# Patient Record
Sex: Male | Born: 1950
Health system: Southern US, Community
[De-identification: ages and names within clinical notes are randomized; demographics above are authoritative.]

## PROBLEM LIST (undated history)

## (undated) DIAGNOSIS — I499 Cardiac arrhythmia, unspecified: Secondary | ICD-10-CM

## (undated) DIAGNOSIS — T8859XA Other complications of anesthesia, initial encounter: Secondary | ICD-10-CM

## (undated) DIAGNOSIS — IMO0001 Reserved for inherently not codable concepts without codable children: Secondary | ICD-10-CM

## (undated) DIAGNOSIS — T4145XA Adverse effect of unspecified anesthetic, initial encounter: Secondary | ICD-10-CM

## (undated) DIAGNOSIS — I10 Essential (primary) hypertension: Secondary | ICD-10-CM

## (undated) DIAGNOSIS — F32A Depression, unspecified: Secondary | ICD-10-CM

## (undated) DIAGNOSIS — I4819 Other persistent atrial fibrillation: Secondary | ICD-10-CM

## (undated) DIAGNOSIS — R32 Unspecified urinary incontinence: Secondary | ICD-10-CM

## (undated) DIAGNOSIS — R112 Nausea with vomiting, unspecified: Secondary | ICD-10-CM

## (undated) DIAGNOSIS — F329 Major depressive disorder, single episode, unspecified: Secondary | ICD-10-CM

## (undated) DIAGNOSIS — F419 Anxiety disorder, unspecified: Secondary | ICD-10-CM

## (undated) DIAGNOSIS — Z9889 Other specified postprocedural states: Secondary | ICD-10-CM

## (undated) HISTORY — PX: REPLACEMENT TOTAL KNEE: SUR1224

## (undated) HISTORY — PX: JOINT REPLACEMENT: SHX530

## (undated) HISTORY — PX: OTHER SURGICAL HISTORY: SHX169

## (undated) HISTORY — DX: Anxiety disorder, unspecified: F41.9

## (undated) HISTORY — PX: PROSTATECTOMY: SHX69

---

## 1987-04-04 HISTORY — PX: BRAIN SURGERY: SHX531

## 2007-11-21 ENCOUNTER — Inpatient Hospital Stay (HOSPITAL_COMMUNITY): Admission: RE | Admit: 2007-11-21 | Discharge: 2007-11-24 | Payer: Self-pay | Admitting: Orthopedic Surgery

## 2010-08-16 NOTE — Op Note (Signed)
NAME:  Edward Johnston, Edward Johnston            ACCOUNT NO.:  [id number]   MEDICAL RECORD NO.:  [id number]          PATIENT TYPE:  INP   LOCATION:  5009                         FACILITY:  [facility]   PHYSICIAN:  Nadara Mustard, MD     DATE OF BIRTH:  31-Aug-1950   DATE OF PROCEDURE:  11/21/2007  DATE OF DISCHARGE:                               OPERATIVE REPORT   PREOPERATIVE DIAGNOSIS:  Osteoarthritis, left knee.   POSTOPERATIVE DIAGNOSIS:  Osteoarthritis, left knee.   PROCEDURE:  Left total knee arthroplasty with a #4 tibia, a #4 femur, 10-  mm posterior stabilized poly with a 42-mm patella.   SURGEON:  Nadara Mustard, MD   ANESTHESIA:  General plus femoral block.   ESTIMATED BLOOD LOSS:  300 mL.   ANTIBIOTICS:  Kefzol 1 g.   DRAINS:  None.   COMPLICATIONS:  None.   TOURNIQUET TIME:  65 minutes at 300 mmHg at the thigh.   DISPOSITION:  To PACU in stable condition.   PROCEDURE:  The patient is a 60 year old gentleman with osteoarthritis  of his left knee.  He has failed conservative care, has pain with  activities of daily living. and presents at this time for total knee  arthroplasty.  Risks and benefits were discussed including infection,  neurovascular injury, persistent pain, and need for additional surgery.  The patient states he understands and wishes to proceed at this time.   DESCRIPTION OF PROCEDURE:  The patient was brought to OR room 1.  After  undergoing a femoral block, he then underwent a general anesthetic.  After adequate level of anesthesia was obtained, the patient's left  lower extremity was prepped using DuraPrep and draped into a sterile  field.  Edward Johnston was used to cover all exposed skin.  A midline incision  was made.  This was carried down to the medial parapatellar retinacular  incision.  This has allowed the patella to be everted.  The femoral  canal reamer was used.  The IM guide for the femur with 5 degrees of  valgus and 11 mm was taken off at distal femur.   This sized for size 4  and the chamfer cuts were made for the size 4 with the cutting block in  place.  Attention was then focused on the tibia.  This was set to take  10 mm off the lateral tibial plateau.  The cut was made on neutral  posterior slope, neutral varus and valgus slope.  The block was then  sized for size 4 and the keel cuts were made for the keel and the keel  was inserted.  This was then tried with a size 10 spacer.  This had good  flexion and extension.  Varus and valgus stability was intact.  The  trial components were removed.  Leg cuts were made for the trial femur.  The patella was resurfaced, then this was sized for 41 and the leg cuts  were made for the 41 patella.  The wound was irrigated with pulsatile  lavage.  The meniscus and all debris was removed.  The posterior aspect  of the  capsule was injected with a total of 30 mL of 0.5% Marcaine  plain.  Care was taken not to inject intravascularly.  The cement was  mixed after cleansing of the bone.  The tibial tray was cemented in  place, loose cement was removed.  The femoral component was inserted,  loose cement was removed.  The tibial tray was placed after pulsatile  lavage and the patella was cemented in place.  The knee was kept in  extension until the cement had hardened.  There was a valgus tilt to the  patella and a lateral release was performed.  The tourniquet was  deflated after 65 minutes.  Hemostasis was obtained.  The retinacular  incision was closed using #1 Vicryl.  The subcu was closed using 2-0  Vicryl.  The skin was closed using approximated staples.  The wound was  covered with Adaptic orthopedic sponges, ABD dressing, Webril, and  Coban.  The patient was extubated and taken to PACU in stable condition.      Nadara Mustard, MD  Electronically Signed     MVD/MEDQ  D:  11/21/2007  T:  11/22/2007  Job:  [id number]

## 2010-08-19 NOTE — Discharge Summary (Signed)
NAME:  Edward Johnston, Edward Johnston            ACCOUNT NO.:  [id number]   MEDICAL RECORD NO.:  [id number]          PATIENT TYPE:  INP   LOCATION:  5009                         FACILITY:  [facility]   PHYSICIAN:  Nadara Mustard, MD     DATE OF BIRTH:  July 19, 1950   DATE OF ADMISSION:  11/21/2007  DATE OF DISCHARGE:  11/24/2007                               DISCHARGE SUMMARY   FINAL DIAGNOSIS:  Osteoarthritis, left knee.   PROCEDURE:  Left total knee arthroplasty with DePuy components rotating  platform, #4 tibia and femur, 10-mm poly tray with a 42-mm patella.   Discharged to home in stable condition with home health physical therapy  [facility].  Follow up in the office in 2 weeks.   HISTORY OF PRESENT ILLNESS:  The patient is a 60 year old gentleman with  osteoarthritis of his left knee.  He has failed conservative care and  has pain with activities of daily living and presents at this time for  total knee arthroplasty.   The patient's hospital course is essentially unremarkable.  He underwent  a left total knee arthroplasty on November 21, 2007.  He received a  femoral block, including his general anesthetic for surgery.  He  received Kefzol for infection prophylaxis, and was started on Coumadin  for DVT prophylaxis.  Postoperatively, the patient progressed well with  his physical therapy.  [facility] was [facility] for [facility] as well  as home health physical therapy.  The patient was discharged to home in  stable condition on November 24, 2007, with prescriptions for Tylox for  pain and Coumadin for DVT prophylaxis with followup in the office in 2  weeks.      Nadara Mustard, MD  Electronically Signed     MVD/MEDQ  D:  01/01/2008  T:  01/01/2008  Job:  [id number]

## 2013-08-18 DIAGNOSIS — N138 Other obstructive and reflux uropathy: Secondary | ICD-10-CM

## 2013-08-18 DIAGNOSIS — N401 Enlarged prostate with lower urinary tract symptoms: Secondary | ICD-10-CM

## 2013-08-18 DIAGNOSIS — C61 Malignant neoplasm of prostate: Secondary | ICD-10-CM

## 2013-08-18 HISTORY — DX: Malignant neoplasm of prostate: C61

## 2013-08-18 HISTORY — DX: Other obstructive and reflux uropathy: N13.8

## 2013-08-18 HISTORY — DX: Benign prostatic hyperplasia with lower urinary tract symptoms: N40.1

## 2013-12-24 DIAGNOSIS — N393 Stress incontinence (female) (male): Secondary | ICD-10-CM | POA: Insufficient documentation

## 2013-12-24 HISTORY — DX: Stress incontinence (female) (male): N39.3

## 2014-01-14 ENCOUNTER — Emergency Department (HOSPITAL_COMMUNITY)
Admission: EM | Admit: 2014-01-14 | Discharge: 2014-01-15 | Disposition: A | Discharge status: Home / Self Care | Payer: MEDICARE | Attending: Emergency Medicine | Admitting: Emergency Medicine

## 2014-01-14 DIAGNOSIS — Z79899 Other long term (current) drug therapy: Secondary | ICD-10-CM | POA: Diagnosis not present

## 2014-01-14 DIAGNOSIS — F329 Major depressive disorder, single episode, unspecified: Secondary | ICD-10-CM | POA: Diagnosis present

## 2014-01-14 DIAGNOSIS — F331 Major depressive disorder, recurrent, moderate: Secondary | ICD-10-CM | POA: Insufficient documentation

## 2014-01-14 DIAGNOSIS — F1310 Sedative, hypnotic or anxiolytic abuse, uncomplicated: Secondary | ICD-10-CM | POA: Insufficient documentation

## 2014-01-14 DIAGNOSIS — I10 Essential (primary) hypertension: Secondary | ICD-10-CM | POA: Diagnosis not present

## 2014-01-14 HISTORY — DX: Essential (primary) hypertension: I10

## 2014-01-14 HISTORY — DX: Unspecified urinary incontinence: R32

## 2014-01-14 HISTORY — DX: Depression, unspecified: F32.A

## 2014-01-14 HISTORY — DX: Major depressive disorder, single episode, unspecified: F32.9

## 2014-01-15 ENCOUNTER — Encounter (HOSPITAL_COMMUNITY): Payer: Self-pay | Admitting: Emergency Medicine

## 2014-01-15 LAB — COMPREHENSIVE METABOLIC PANEL
ALK PHOS: 52 U/L (ref 39–117)
ALT: 27 U/L (ref 0–53)
ANION GAP: 10 (ref 5–15)
AST: 31 U/L (ref 0–37)
Albumin: 3.9 g/dL (ref 3.5–5.2)
BILIRUBIN TOTAL: 0.3 mg/dL (ref 0.3–1.2)
BUN: 17 mg/dL (ref 6–23)
CHLORIDE: 103 meq/L (ref 96–112)
CO2: 26 meq/L (ref 19–32)
CREATININE: 1.12 mg/dL (ref 0.50–1.35)
Calcium: 9.4 mg/dL (ref 8.4–10.5)
GFR calc Af Amer: 78 mL/min — ABNORMAL LOW (ref >90)
GFR, EST NON AFRICAN AMERICAN: 67 mL/min — AB (ref >90)
Glucose, Bld: 123 mg/dL — ABNORMAL HIGH (ref 70–99)
POTASSIUM: 4.8 meq/L (ref 3.7–5.3)
Sodium: 139 mEq/L (ref 137–147)
Total Protein: 7.5 g/dL (ref 6.0–8.3)

## 2014-01-15 LAB — CBC
HEMATOCRIT: 42.9 % (ref 39.0–52.0)
HEMOGLOBIN: 14.7 g/dL (ref 13.0–17.0)
MCH: 30.5 pg (ref 26.0–34.0)
MCHC: 34.3 g/dL (ref 30.0–36.0)
MCV: 89 fL (ref 78.0–100.0)
Platelets: 210 10*3/uL (ref 150–400)
RBC: 4.82 MIL/uL (ref 4.22–5.81)
RDW: 13.2 % (ref 11.5–15.5)
WBC: 7.2 10*3/uL (ref 4.0–10.5)

## 2014-01-15 LAB — RAPID URINE DRUG SCREEN, HOSP PERFORMED
Amphetamines: NOT DETECTED
BARBITURATES: NOT DETECTED
BENZODIAZEPINES: POSITIVE — AB
COCAINE: NOT DETECTED
Opiates: NOT DETECTED
TETRAHYDROCANNABINOL: NOT DETECTED

## 2014-01-15 LAB — ACETAMINOPHEN LEVEL

## 2014-01-15 LAB — ETHANOL

## 2014-01-15 LAB — SALICYLATE LEVEL: Salicylate Lvl: <2 mg/dL — ABNORMAL LOW (ref 2.8–20.0)

## 2014-01-15 MED ORDER — LORAZEPAM 1 MG PO TABS
1.0000 mg | ORAL_TABLET | Freq: Once | ORAL | Status: AC
  Administered 2014-01-15: 1 mg via ORAL
  Filled 2014-01-15: qty 1

## 2014-01-15 MED ORDER — LORAZEPAM 1 MG PO TABS: 1.0000 mg | ORAL_TABLET | Freq: Three times a day (TID) | ORAL | Status: DC | PRN

## 2014-01-15 NOTE — ED Provider Notes (Signed)
Medical screening examination/treatment/procedure(s) were performed by non-physician practitioner and as supervising physician I was immediately available for consultation/collaboration.   EKG Interpretation None        Debby Freiberg, MD 01/15/14 2250

## 2014-01-15 NOTE — ED Provider Notes (Signed)
CSN: [id number]     Arrival date & time 01/14/14  2328 History   First MD Initiated Contact with Patient 01/15/14 0107     Chief Complaint  Patient presents with  . Suicidal  . Depression     (Consider location/radiation/quality/duration/timing/severity/associated sxs/prior Treatment) HPI Comments: Patient is a 63 year old male with a past medical history of depression who presents with worsening feelings of hopelessness and episodes of sobbing. Patient has struggled with depression for the past 20 years and has seen Dr. Reece Levy as his psychiatrist. Patient is becoming frustrated with his care because he feels as though he is not being heard by his doctor. This current episode has continued for the past 3 months. Patient's daughter is present with the patient who has been increasingly worried about her father. She states he will lose focus and do things he doesn't remember such as leaving his car door open in the driveway and the front door open. Patient denies drug and alcohol use. He denies SI and HI. Patient was seen by a Mobile Crisis Unit prior to coming to the ED tonight.    Past Medical History  Diagnosis Date  . Depression   . Incontinence   . Hypertension     due to psych med   Past Surgical History  Procedure Laterality Date  . Prostatectomy    . Replacement total knee    .  aneurysm clip     No family history on file. History  Substance Use Topics  . Smoking status: Never Smoker   . Smokeless tobacco: Not on file  . Alcohol Use: No    Review of Systems  Constitutional: Negative for fever, chills and fatigue.  HENT: Negative for trouble swallowing.   Eyes: Negative for visual disturbance.  Respiratory: Negative for shortness of breath.   Cardiovascular: Negative for chest pain and palpitations.  Gastrointestinal: Negative for nausea, vomiting, abdominal pain and diarrhea.  Genitourinary: Negative for dysuria and difficulty urinating.  Musculoskeletal: Negative for  arthralgias and neck pain.  Skin: Negative for color change.  Neurological: Negative for dizziness and weakness.  Psychiatric/Behavioral: Positive for dysphoric mood.      Allergies  Review of patient's allergies indicates no known allergies.  Home Medications   Prior to Admission medications   Medication Sig Start Date End Date Taking? Authorizing Provider  buPROPion (WELLBUTRIN XL) 150 MG 24 hr tablet Take 150 mg by mouth daily.   Yes Historical Provider, MD  clorazepate (TRANXENE) 7.5 MG tablet Take 7.5 mg by mouth 2 (two) times daily as needed for anxiety.   Yes Historical Provider, MD  ibuprofen (ADVIL,MOTRIN) 200 MG tablet Take 400 mg by mouth every 6 (six) hours as needed.   Yes Historical Provider, MD  Levomilnacipran HCl ER (FETZIMA) 20 MG CP24 Take 1 tablet by mouth daily.   Yes Historical Provider, MD  losartan (COZAAR) 50 MG tablet Take 50 mg by mouth daily.   Yes Historical Provider, MD  risperiDONE (RISPERDAL) 0.5 MG tablet Take 0.5 mg by mouth daily.   Yes Historical Provider, MD   BP 138/84  Pulse 80  Temp(Src) 98.1 F (36.7 C) (Oral)  Resp 18  Ht 6\' 2"  (1.88 m)  Wt 265 lb (120.203 kg)  BMI 34.01 kg/m2  SpO2 96% Physical Exam  Nursing note and vitals reviewed. Constitutional: He is oriented to person, place, and time. He appears well-developed and well-nourished. No distress.  HENT:  Head: Normocephalic and atraumatic.  Eyes: Conjunctivae and EOM are  normal. Pupils are equal, round, and reactive to light.  Neck: Normal range of motion.  Cardiovascular: Normal rate and regular rhythm.  Exam reveals no gallop and no friction rub.   No murmur heard. Pulmonary/Chest: Effort normal and breath sounds normal. He has no wheezes. He has no rales. He exhibits no tenderness.  Abdominal: Soft. He exhibits no distension. There is no tenderness. There is no rebound.  Musculoskeletal: Normal range of motion.  Neurological: He is alert and oriented to person, place, and  time. Coordination normal.  Speech is goal-oriented. Moves limbs without ataxia.   Skin: Skin is warm and dry.  Psychiatric:  Dysphoric mood. Patient is tearful.     ED Course  Procedures (including critical care time) Labs Review Labs Reviewed  COMPREHENSIVE METABOLIC PANEL - Abnormal; Notable for the following:    Glucose, Bld 123 (*)    GFR calc non Af Amer 67 (*)    GFR calc Af Amer 78 (*)    All other components within normal limits  SALICYLATE LEVEL - Abnormal; Notable for the following:    Salicylate Lvl <5.2 (*)    All other components within normal limits  URINE RAPID DRUG SCREEN (HOSP PERFORMED) - Abnormal; Notable for the following:    Benzodiazepines POSITIVE (*)    All other components within normal limits  ACETAMINOPHEN LEVEL  CBC  ETHANOL    Imaging Review No results found.   EKG Interpretation None      MDM   Final diagnoses:  Major depressive disorder, recurrent episode, moderate    3:20 AM Patient's labs unremarkable for acute changes. Patient will remain in the ED to meet with psychiatrist in the morning. Vitals stable and patient afebrile.   4:46 AM Patient decided to leave and follow up with outpatient resources. Patient requested a difference PRN anxiety medication to have in place of Tranxene. Patient will have small course of ativan to take PRN for anxiety. Vitals stable and patient afebrile.   Alvina Chou, PA-C 01/15/14 [phone]

## 2014-01-15 NOTE — BH Assessment (Signed)
Tele Assessment Note   Edward Johnston is a 63 y.o. male who voluntarily presents via Mobile Crisis.  Pt denies SI/HI/AVH, stating that he came to the hospital due to increased depressive sxs: anhedonia, crying spells, helplessness and isolation from family and friends.  Pt reports that he had prostate surgery approx 4 months ago and the procedure left him feeling inferior and less of man-"my back is against the wall and there's no more fight left in me".  Pt is currently receiving outpatient services with [facility]) and says he's been trying to get better but nothing is working.  He and daughter(at bedside) don't feel that Dr. Reece Levy is helping pt cope with his worsening depression.  He says that his physical and mental functioning continues to decompensate since the surgery and he is especially having a hard time coping with the recovery time from his surgery. Pt.'s medications have changed because of complications with his blood pressure and he has been more forgetful and careless in his actions.  Pt is able to contract for safety and lives with his wife.  This Probation officer discussed disposition with Patriciaann Clan, who suggested [facility], however pt has decided to d/c with referrals.    Axis I: Major Depression, Recurrent severe Axis II: Deferred Axis III:  Past Medical History  Diagnosis Date  . Depression   . Incontinence   . Hypertension     due to psych med   Axis IV: other psychosocial or environmental problems, problems related to social environment, problems with access to health care services and problems with primary support group Axis V: 41-50 serious symptoms  Past Medical History:  Past Medical History  Diagnosis Date  . Depression   . Incontinence   . Hypertension     due to psych med    Past Surgical History  Procedure Laterality Date  . Prostatectomy    . Replacement total knee    .  aneurysm clip      Family History: No family history on  file.  Social History:  reports that he has never smoked. He does not have any smokeless tobacco history on file. He reports that he does not drink alcohol or use illicit drugs.  Additional Social History:  Alcohol / Drug Use Pain Medications: See MAR  Prescriptions: See MAR  Over the Counter: See MAR  History of alcohol / drug use?: No history of alcohol / drug abuse Longest period of sobriety (when/how long): None   CIWA: CIWA-Ar BP: 138/84 mmHg Pulse Rate: 80 COWS:    PATIENT STRENGTHS: (choose at least two) Communication skills Motivation for treatment/growth Supportive family/friends  Allergies: No Known Allergies  Home Medications:  (Not in a hospital admission)  OB/GYN Status:  No LMP for male patient.  General Assessment Data Location of Assessment: [facility] ED Is this a Tele or Face-to-Face Assessment?: Face-to-Face Is this an Initial Assessment or a Re-assessment for this encounter?: Initial Assessment Living Arrangements: Spouse/significant other Can pt return to current living arrangement?: Yes Admission Status: Voluntary Is patient capable of signing voluntary admission?: Yes Transfer from: Home Referral Source: Self/Family/Friend  Medical Screening Exam ([place]) Medical Exam completed: No Reason for MSE not completed: Other: (None )  [facility] Crisis Care Plan Living Arrangements: Spouse/significant other Name of Psychiatrist: Dr. Reece Levy  Name of Therapist: [place]   Education Status Is patient currently in school?: No Current Grade: None  Highest grade of school patient has completed: None  Name  of school: None  Contact person: None   Risk to self with the past 6 months Suicidal Ideation: No Suicidal Intent: No Is patient at risk for suicide?: No Suicidal Plan?: No Access to Means: No What has been your use of drugs/alcohol within the last 12 months?: None  Previous Attempts/Gestures: No How many times?: 0 Other Self Harm  Risks: None  Triggers for Past Attempts: None known Intentional Self Injurious Behavior: None Family Suicide History: No Recent stressful life event(s): Recent negative physical changes (Prostate surgery few months ago ) Persecutory voices/beliefs?: No Depression: Yes Depression Symptoms: Loss of interest in usual pleasures;Feeling worthless/self pity;Tearfulness;Insomnia;Despondent Substance abuse history and/or treatment for substance abuse?: No Suicide prevention information given to non-admitted patients: Not applicable  Risk to Others within the past 6 months Homicidal Ideation: No Thoughts of Harm to Others: No Current Homicidal Intent: No Current Homicidal Plan: No Access to Homicidal Means: No Identified Victim: None  History of harm to others?: No Assessment of Violence: None Noted Violent Behavior Description: None  Does patient have access to weapons?: No Criminal Charges Pending?: No Does patient have a court date: No  Psychosis Hallucinations: None noted Delusions: None noted  Mental Status Report Appear/Hygiene: In scrubs Eye Contact: Fair Motor Activity: Unremarkable Speech: Logical/coherent;Soft Level of Consciousness: Alert Mood: Depressed;Helpless Affect: Depressed;Flat Anxiety Level: None Thought Processes: Coherent;Relevant Judgement: Unimpaired Orientation: Place;Person;Time;Situation Obsessive Compulsive Thoughts/Behaviors: None  Cognitive Functioning Concentration: Normal Memory: Recent Intact;Remote Intact IQ: Average Insight: Fair Impulse Control: Good Appetite: Fair Weight Loss: 0 Weight Gain: 0 Sleep: Increased Total Hours of Sleep: 8 Vegetative Symptoms: None  ADLScreening [facility]) Patient's cognitive ability adequate to safely complete daily activities?: Yes Patient able to express need for assistance with ADLs?: Yes Independently performs ADLs?: Yes (appropriate for developmental age)  Prior Inpatient  Therapy Prior Inpatient Therapy: Yes Prior Therapy Dates: 1950,9326 Prior Therapy Facilty/Provider(s): [facility]  Reason for Treatment: Depression   Prior Outpatient Therapy Prior Outpatient Therapy: Yes Prior Therapy Dates: Current  Prior Therapy Facilty/Provider(s): Dr. Reddy/[facility] Reason for Treatment: Therapy/ Med Mgt   ADL Screening (condition at time of admission) Patient's cognitive ability adequate to safely complete daily activities?: Yes Is the patient deaf or have difficulty hearing?: No Does the patient have difficulty seeing, even when wearing glasses/contacts?: No Does the patient have difficulty concentrating, remembering, or making decisions?: Yes Patient able to express need for assistance with ADLs?: Yes Does the patient have difficulty dressing or bathing?: No Independently performs ADLs?: Yes (appropriate for developmental age) Does the patient have difficulty walking or climbing stairs?: No Weakness of Legs: None Weakness of Arms/Hands: None  Home Assistive Devices/Equipment Home Assistive Devices/Equipment: None  Therapy Consults (therapy consults require a physician order) PT Evaluation Needed: No OT Evalulation Needed: No SLP Evaluation Needed: No Abuse/Neglect Assessment (Assessment to be complete while patient is alone) Physical Abuse: Denies Verbal Abuse: Denies Sexual Abuse: Denies Exploitation of patient/patient's resources: Denies Self-Neglect: Denies Values / Beliefs Cultural Requests During Hospitalization: None Spiritual Requests During Hospitalization: None Consults Spiritual Care Consult Needed: No Social Work Consult Needed: No Regulatory affairs officer (For Healthcare) Does patient have an advance directive?: No Would patient like information on creating an advanced directive?: No - patient declined information Nutrition Screen- [facility] Patient's home diet: Regular  Additional Information 1:1 In Past 12 Months?: No CIRT  Risk: No Elopement Risk: No Does patient have medical clearance?: Yes     Disposition:  Disposition Initial Assessment Completed for this Encounter: Yes  Disposition of Patient: Outpatient treatment (Pt provided referrals ) Type of outpatient treatment: Adult (Outpatient referrals )  Girtha Rm 01/15/2014 4:33 AM

## 2014-01-15 NOTE — Discharge Instructions (Signed)
Take ativan as needed in place of tranxene. Refer to attached documents for more information. Follow up with the resources provided by [facility].

## 2014-01-15 NOTE — ED Notes (Signed)
Family reports patient has a hx of depression.  Pt and family deny patient stating any suicidal ideations or homicidal ideations but family reports that do not trust patient to not harm himself.  Family reports this episode has been going on for 3 months.  Reports he just lays around and cries all day.  Family reports meds and psychiatrist are not working at this point. Family reports patient already had consult with therapeutic alternatives by [facility].  Family brought paper and reports she is suppose to send evaluation here.  Pt keeps stating "my back is up against the wall i dont know what to do".

## 2014-01-16 DIAGNOSIS — F331 Major depressive disorder, recurrent, moderate: Secondary | ICD-10-CM | POA: Insufficient documentation

## 2014-01-16 HISTORY — DX: Major depressive disorder, recurrent, moderate: F33.1

## 2014-01-17 DIAGNOSIS — I10 Essential (primary) hypertension: Secondary | ICD-10-CM | POA: Insufficient documentation

## 2014-01-31 DIAGNOSIS — R972 Elevated prostate specific antigen [PSA]: Secondary | ICD-10-CM | POA: Insufficient documentation

## 2014-01-31 DIAGNOSIS — N529 Male erectile dysfunction, unspecified: Secondary | ICD-10-CM | POA: Insufficient documentation

## 2014-01-31 DIAGNOSIS — R590 Localized enlarged lymph nodes: Secondary | ICD-10-CM | POA: Insufficient documentation

## 2014-01-31 DIAGNOSIS — R319 Hematuria, unspecified: Secondary | ICD-10-CM | POA: Insufficient documentation

## 2014-01-31 HISTORY — DX: Elevated prostate specific antigen (PSA): R97.20

## 2014-01-31 HISTORY — DX: Localized enlarged lymph nodes: R59.0

## 2014-01-31 HISTORY — DX: Male erectile dysfunction, unspecified: N52.9

## 2014-01-31 HISTORY — DX: Hematuria, unspecified: R31.9

## 2014-02-03 DIAGNOSIS — F322 Major depressive disorder, single episode, severe without psychotic features: Secondary | ICD-10-CM

## 2014-02-03 HISTORY — DX: Major depressive disorder, single episode, severe without psychotic features: F32.2

## 2014-02-04 DIAGNOSIS — F329 Major depressive disorder, single episode, unspecified: Secondary | ICD-10-CM

## 2014-02-04 HISTORY — DX: Major depressive disorder, single episode, unspecified: F32.9

## 2014-02-24 ENCOUNTER — Other Ambulatory Visit (HOSPITAL_COMMUNITY): Payer: No Typology Code available for payment source | Attending: Psychiatry | Admitting: Psychiatry

## 2014-02-24 ENCOUNTER — Encounter (HOSPITAL_COMMUNITY): Payer: Self-pay

## 2014-02-24 DIAGNOSIS — I10 Essential (primary) hypertension: Secondary | ICD-10-CM | POA: Diagnosis not present

## 2014-02-24 DIAGNOSIS — F329 Major depressive disorder, single episode, unspecified: Secondary | ICD-10-CM | POA: Diagnosis present

## 2014-02-24 DIAGNOSIS — F419 Anxiety disorder, unspecified: Secondary | ICD-10-CM | POA: Insufficient documentation

## 2014-02-24 DIAGNOSIS — F331 Major depressive disorder, recurrent, moderate: Secondary | ICD-10-CM

## 2014-02-24 NOTE — Progress Notes (Signed)
Psychiatric Assessment Adult  Patient Identification:  Edward Johnston Date of Evaluation:  02/24/2014 Chief Complaint: depression with anxiety History of Chief Complaint:   Chief Complaint  Patient presents with  . Depression  . Anxiety    Anxiety Presents for initial visit. Onset was 1 to 6 months ago. The problem has been gradually worsening. Symptoms include decreased concentration, depressed mood, excessive worry, irritability, malaise and nervous/anxious behavior. Symptoms occur constantly. The severity of symptoms is interfering with daily activities. The symptoms are aggravated by family issues. The patient sleeps 8 hours per night. The quality of sleep is good. Nighttime awakenings: occasional.   There are no known risk factors. His past medical history is significant for depression. Past treatments include SSRIs. The treatment provided mild relief. Compliance with prior treatments has been good.   Review of Systems  Constitutional: Positive for irritability.  Psychiatric/Behavioral: Positive for decreased concentration. The patient is nervous/anxious.    Physical Exam  Depressive Symptoms: depressed mood, anhedonia, psychomotor retardation, fatigue, feelings of worthlessness/guilt, difficulty concentrating, impaired memory, anxiety, loss of energy/fatigue,  (Hypo) Manic Symptoms:   Elevated Mood:  Negative Irritable Mood:  Yes Grandiosity:  Negative Distractibility:  Negative Labiality of Mood:  Negative Delusions:  Negative Hallucinations:  Negative Impulsivity:  Negative Sexually Inappropriate Behavior:  Negative Financial Extravagance:  Negative Flight of Ideas:  Negative  Anxiety Symptoms: Excessive Worry:  Yes Panic Symptoms:  Negative Agoraphobia:  Negative Obsessive Compulsive: Negative  Symptoms: None, Specific Phobias:  Negative Social Anxiety:  Negative  Psychotic Symptoms:  Hallucinations: Negative None Delusions:  Negative Paranoia:   Negative   Ideas of Reference:  Negative  PTSD Symptoms: Ever had a traumatic exposure:  Negative Had a traumatic exposure in the last month:  Negative Re-experiencing: Negative None Hypervigilance:  Negative Hyperarousal: Negative None Avoidance: Negative None  Traumatic Brain Injury: Negative  NA  Past Psychiatric History: Diagnosis: Major depression, recurrent severe without psychosis  Hospitalizations: just released from [facility]  Outpatient Care: sees primary care doctor  Substance Abuse Care: none  Self-Mutilation: none  Suicidal Attempts: none  Violent Behaviors: none   Past Medical History:   Past Medical History  Diagnosis Date  . Depression   . Incontinence   . Hypertension     due to psych med  . Anxiety    History of Loss of Consciousness:  Negative Seizure History:  Negative Cardiac History:  Negative Allergies:  No Known Allergies Current Medications:  Current Outpatient Prescriptions  Medication Sig Dispense Refill  . ARIPiprazole (ABILIFY) 2 MG tablet Take 2 mg by mouth daily.    Marland Kitchen buPROPion (WELLBUTRIN XL) 150 MG 24 hr tablet Take 150 mg by mouth daily.    . clorazepate (TRANXENE) 7.5 MG tablet Take 7.5 mg by mouth 2 (two) times daily as needed for anxiety.    Marland Kitchen ibuprofen (ADVIL,MOTRIN) 200 MG tablet Take 400 mg by mouth every 6 (six) hours as needed.    Marland Kitchen losartan (COZAAR) 50 MG tablet Take 50 mg by mouth daily.    . traZODone (DESYREL) 100 MG tablet Take 100 mg by mouth at bedtime.    . Vilazodone HCl (VIIBRYD) 40 MG TABS Take by mouth daily.    . Levomilnacipran HCl ER (FETZIMA) 20 MG CP24 Take 1 tablet by mouth daily.    Marland Kitchen LORazepam (ATIVAN) 1 MG tablet Take 1 tablet (1 mg total) by mouth 3 (three) times daily as needed for anxiety. 15 tablet 0  . risperiDONE (RISPERDAL) 0.5 MG tablet  Take 0.5 mg by mouth daily.     No current facility-administered medications for this visit.    Previous Psychotropic  Medications:  Medication Dose   aripiprazole  4 mg daily  buproprion  100 mg daily  tranxene 7.5 mg twice daily as needed  Viibryd 40 mg daily  trazodone 100 mg hs  Losartan potassium  50 mg daily      Substance Abuse History in the last 12 months:none                                                                                                   Medical Consequences of Substance Abuse: none  Legal Consequences of Substance Abuse: none  Family Consequences of Substance Abuse: none  Blackouts:  Negative DT's:  Negative Withdrawal Symptoms:  Negative None  Social History: Current Place of Residence: [facility] of Birth: did not ask Family Members: lives with wife of 32 years Marital Status:  Married Children: 2  Sons: 1  Daughters: 1 Relationships: close to family members including a sister Education:  [employer] Soil scientist Problems/Performance: good Religious Beliefs/Practices: none reported History of Abuse: none Occupational Experiences;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 did not ask Legal History: none Hobbies/Interests: hunting and fishing  Family History:   Family History  Problem Relation Age of Onset  . Depression Sister     Mental Status Examination/Evaluation: Objective:  Appearance: Fairly Groomed  Engineer, water::  Good  Speech:  Slurred but understandable  Volume:  Decreased  Mood:  Depressed and anxious  Affect:  Depressed  Thought Process:  Coherent and Logical  Orientation:  Full (Time, Place, and Person)  Thought Content:  Negative  Suicidal Thoughts:  No  Homicidal Thoughts:  No  Judgement:  Intact  Insight:  Fair  Psychomotor Activity:  slow almost shuffling  Akathisia:  Negative  Handed:  Right  AIMS (if indicated):  0  Assets:  Communication Skills Desire for Improvement Financial Resources/Insurance Housing Intimacy Leisure Time Physical Health Social  Support Talents/Skills Transportation Vocational/Educational    Laboratory/X-Ray Psychological Evaluation(s)   none  none   Treatment Plan/Recommendations:  Plan: group therapy daily, Will continue current medications  Laboratory:  none  Psychotherapy: daily group  Medications: continue current meds  Routine PRN Medications:  Negative  Consultations: none  Safety Concerns:  Shuffles a bit and slurs speech  ?too much tranxene  Other:      Clarene Reamer, MD 11/24/20151:13 PM

## 2014-02-24 NOTE — Progress Notes (Signed)
Edward Johnston is a 63 y.o., married, retired, Caucasian male,  who was referred per [facility]. Pt states he was admitted there for six days due to depression, anxiety symptoms with SI.  Continues to report passive SI, but is able to contract for safety.  Pt denies HI or A/V hallucinations.  Other symptoms include:  Tearfulness, poor concentration, fatigue, anhedonia, increased sleep, isolation, no motivation, ruminating thoughts, feelings of hopelessness, helplessness and worthlessness.  Triggers/Stressors:  1)  Unresolved grief/loss issues:  Pt reports that he had prostate surgery approx 4 months ago and the procedure left him feeling inferior and less of man-"my back is against the wall and there's no more fight left in me". He says that his physical and mental functioning continues to decompensate since the surgery and he is especially having a hard time coping with the recovery time from his surgery.  Also, patient is grieving the loss of his mother two years ago.  She died of cancer. Prior to being admitted at [facility]; pt was admitted at [facility] in [place] twenty plus yrs ago.  Denies any prior suicide attempts or gestures.  Has been seeing Dr. Reece Levy for medication mgmt for years.  Has seen Oliver Pila, [facility] three times. Family Hx:  Sister (depression).  Childhood:  Pt was born and raised in [place], [place].  Reports a "normal" childhood.  Denies trauma or abuse. Siblings:  One older and one younger sister.  Was accompanied by one sister today. Kids:  31 yr old daughter and 45 yr old son.  Has been married to supportive wife for forty-five years.  Pt retired three yrs ago from [employer].  Reports no difficulty with retirement. Denies drugs/ETOH, cigarettes, past DUI's, or legal issues.  States his support system includes wife and kids. Pt completed all forms.  Pt will attend MH-IOP for ten days.  A:  Oriented pt.  Provided pt with an orientation folder.  Informed Dr. Reece Levy and  Oliver Pila, [facility] of admit.  Encouraged support groups.  R:  Pt receptive.

## 2014-02-24 NOTE — Progress Notes (Signed)
Daily Group Progress Note  Program: IOP  Group Time: 9:00-10:30  Participation Level: Active  Behavioral Response: Appropriate  Type of Therapy:  Group Therapy  Summary of Progress: Pt. Met with case manager and psychiatrist.      Group Time: 10:30-12:00  Participation Level:  Active  Behavioral Response: Appropriate  Type of Therapy: Psycho-education Group  Summary of Progress: Pt. Shared that he has been challenged by depression for the last 25 years. Pt. Reports that aneurysm at a young age triggered his depression and more recently triggered by surgery for prostate cancer.  Nancie Neas, LPC

## 2014-02-25 ENCOUNTER — Other Ambulatory Visit (HOSPITAL_COMMUNITY): Payer: No Typology Code available for payment source

## 2014-02-27 ENCOUNTER — Other Ambulatory Visit (HOSPITAL_COMMUNITY): Payer: No Typology Code available for payment source

## 2014-03-02 ENCOUNTER — Other Ambulatory Visit (HOSPITAL_COMMUNITY): Payer: No Typology Code available for payment source | Admitting: Psychiatry

## 2014-03-02 DIAGNOSIS — F329 Major depressive disorder, single episode, unspecified: Secondary | ICD-10-CM | POA: Diagnosis not present

## 2014-03-02 DIAGNOSIS — F331 Major depressive disorder, recurrent, moderate: Secondary | ICD-10-CM

## 2014-03-02 NOTE — Progress Notes (Signed)
    Daily Group Progress Note  Program: IOP  Group Time: 9:00-10:30  Participation Level: Minimal  Behavioral Response: Appropriate  Type of Therapy:  Group Therapy  Summary of Progress: Pt. Presents with slurred speech, tearful, lethargic. Pt. Appears to suffer from cognitive impairment as evidenced by slurred speech and thought interruption and delay.  Pt. Reports that he is "not so good". Pt. Reports that he had a good Thanksgiving. Pt. Reports that he tried to stay busy fishing, hunting, and riding his four wheeler. Pt. Reports that he is very worried about trouble that he is having walking and with balance. Pt. Not sure if balance is related to depression or other physical condition.      Group Time: 10:30-12:00  Participation Level:  Minimal  Behavioral Response: Appropriate  Type of Therapy: Psycho-education Group  Summary of Progress: Pt. Participated in grief and loss group facilitated by Jeanella Craze.   Nancie Neas, LPC

## 2014-03-03 ENCOUNTER — Other Ambulatory Visit (HOSPITAL_COMMUNITY): Payer: No Typology Code available for payment source | Attending: Psychiatry | Admitting: Psychiatry

## 2014-03-03 DIAGNOSIS — F329 Major depressive disorder, single episode, unspecified: Secondary | ICD-10-CM | POA: Diagnosis not present

## 2014-03-03 DIAGNOSIS — I10 Essential (primary) hypertension: Secondary | ICD-10-CM | POA: Insufficient documentation

## 2014-03-03 DIAGNOSIS — F331 Major depressive disorder, recurrent, moderate: Secondary | ICD-10-CM

## 2014-03-03 NOTE — Progress Notes (Signed)
Patient ID: Edward Johnston, male   DOB: 24-Aug-1950, 63 y.o.   MRN: [id number] [name] reports he changed his aripiprazole to bedtime from the morning because of the balance issues and slurred speech.  It has been only one day but he does slur less and seems to have a steadier gait.  He will bring in a list of what he is actually taking tomorrow so that I can review it.  He did not mention the low testosterone level but has been told to take that up with his PCP.  He had a good Thanksgiving, enjoyed his family, got out and did some hunting and drove the 4-wheeler which he enjoyed and is not crying every day.  Feels some better he says.

## 2014-03-03 NOTE — Progress Notes (Signed)
    Daily Group Progress Note  Program: IOP  Group Time: 9:00-10:30  Participation Level: Active  Behavioral Response: Appropriate  Type of Therapy:  Group Therapy  Summary of Progress: Pt. Presented less tearful and more alert than yesterday. Pt.'s speech continues to be slurred. Pt. Reports that he slept well last night. Pt. Reports that he changed his medication from day to night and that helped with balance problems. Pt. Reports that he continues to try to be physically active and work outside as much as he can.      Group Time: 10:30-12:00  Participation Level:  Active  Behavioral Response: Appropriate  Type of Therapy: Psycho-education Group  Summary of Progress: Pt. Participated in discussion about the effects of chronic stress on mental health.   Nancie Neas, LPC

## 2014-03-04 ENCOUNTER — Other Ambulatory Visit (HOSPITAL_COMMUNITY): Payer: No Typology Code available for payment source | Admitting: Psychiatry

## 2014-03-04 DIAGNOSIS — F329 Major depressive disorder, single episode, unspecified: Secondary | ICD-10-CM | POA: Diagnosis not present

## 2014-03-04 DIAGNOSIS — F331 Major depressive disorder, recurrent, moderate: Secondary | ICD-10-CM

## 2014-03-04 NOTE — Progress Notes (Signed)
Patient ID: Edward Johnston, male   DOB: Aug 02, 1950, 63 y.o.   MRN: [id number] [name] brought in his list of medications. He takes buproprion XL 300 mg in am, aripiprazole 2 mg at bedtime, Viibryd 40 mg daily, clorazepate 7.5 mg two or three times daily, trazodone 100 mg hs, Losartan 50 mg daily, and will start benztropine 0.5 mg daily to see if it will help the stiffness.  I explained benztropine has its own set of side effects.  He continues looking and feeling some better.  I do not see the need to change any of his meds currently.

## 2014-03-04 NOTE — Progress Notes (Signed)
    Daily Group Progress Note  Program: IOP  Group Time: 9:00-10:30  Participation Level: Minimal  Behavioral Response: Appropriate  Type of Therapy:  Group Therapy  Summary of Progress: Pt. Continues to present with primarily flat affect, slurred speech. Pt. Is less tearful. Pt. Reports that he has supportive wife and children. Pt. Continues to grieve loss of self following prostate surgery and challenges related to incontinence.      Group Time: 10:30-12:00  Participation Level:  Minimal  Behavioral Response: Appropriate  Type of Therapy: Psycho-education Group  Summary of Progress: Pt. Participated in discussion about self-compassion.   Nancie Neas, LPC

## 2014-03-05 ENCOUNTER — Other Ambulatory Visit (HOSPITAL_COMMUNITY): Payer: No Typology Code available for payment source | Admitting: Psychiatry

## 2014-03-05 DIAGNOSIS — F331 Major depressive disorder, recurrent, moderate: Secondary | ICD-10-CM

## 2014-03-05 DIAGNOSIS — F329 Major depressive disorder, single episode, unspecified: Secondary | ICD-10-CM | POA: Diagnosis not present

## 2014-03-05 NOTE — Progress Notes (Signed)
    Daily Group Progress Note  Program: IOP  Group Time: 9:00-10:30  Participation Level: Active  Behavioral Response: Appropriate  Type of Therapy:  Group Therapy  Summary of Progress: Pt. Reports that he walked 6 laps around his cornfield. Pt. Reported that he feels better and his mood is more positive when he is physically active. Pt. Reported that he had motivation to repair flashlights yesterday. Pt. Reports that his mood is improving, feels more balanced when walking, and that his appetite is improving.      Group Time: 10:30-12:00  Participation Level:  Active  Behavioral Response: Appropriate  Type of Therapy: Psycho-education Group  Summary of Progress: Pt. Participated in discussion about developing self-compassion. Pt. Watched and discussed Marilynn Latino video.   Nancie Neas, LPC

## 2014-03-06 ENCOUNTER — Other Ambulatory Visit (HOSPITAL_COMMUNITY): Payer: No Typology Code available for payment source | Admitting: Psychiatry

## 2014-03-06 DIAGNOSIS — F329 Major depressive disorder, single episode, unspecified: Secondary | ICD-10-CM | POA: Diagnosis not present

## 2014-03-06 DIAGNOSIS — F331 Major depressive disorder, recurrent, moderate: Secondary | ICD-10-CM

## 2014-03-09 ENCOUNTER — Other Ambulatory Visit (HOSPITAL_COMMUNITY): Payer: No Typology Code available for payment source

## 2014-03-09 NOTE — Progress Notes (Signed)
    Daily Group Progress Note  Program: IOP  Group Time: 9:00-10:30  Participation Level: Active  Behavioral Response: Appropriate  Type of Therapy:  Group Therapy  Summary of Progress: Pt. Reported that he was "doing good and feeling much better". Pt. Reported that his appetite continues to be good and he is sleeping well 8-9 hours a night. Pt. Reports that he is keeping in touch with his friends about hunting season and looking forward to going hunting this weekend. Pt. Reports that he believes that he was overmedicated when hen entered the program, but feels better on medications now.      Group Time: 10:30-12:00  Participation Level:  Active  Behavioral Response: Appropriate  Type of Therapy: Psycho-education Group  Summary of Progress: Pt. Participated in discussion about developing meditation practice and participated in guided meditation activity.   Nancie Neas, LPC

## 2014-03-10 ENCOUNTER — Other Ambulatory Visit (HOSPITAL_COMMUNITY): Payer: No Typology Code available for payment source | Admitting: Psychiatry

## 2014-03-10 DIAGNOSIS — F331 Major depressive disorder, recurrent, moderate: Secondary | ICD-10-CM

## 2014-03-10 DIAGNOSIS — F329 Major depressive disorder, single episode, unspecified: Secondary | ICD-10-CM | POA: Diagnosis not present

## 2014-03-11 ENCOUNTER — Other Ambulatory Visit (HOSPITAL_COMMUNITY): Payer: No Typology Code available for payment source

## 2014-03-11 NOTE — Progress Notes (Signed)
    Daily Group Progress Note  Program: IOP  Group Time: 2458-0998  Participation Level: Active  Behavioral Response: Appropriate and Sharing  Type of Therapy:  Group Therapy  Summary of Progress: Pt arrived this morning stating that he felt bashful and interested.  States he was interested in the groups today, but felt bashful.  States that he doesn't really say much in the groups, but has been enjoying attending.  Other peers gave him the positive feedback that he is opening up more now.  Pt states he is looking forward to [employer] dinner and seeing the grandkids open up presents.     Group Time: 1045-1200  Participation Level:  Active  Behavioral Response: Appropriate  Type of Therapy: Psycho-education Group  Summary of Progress: Stress, depression and the Holidays:  Discussed holiday stress, triggers, and tips to prevent it.  Took a stress audit and patients were able to score it and see how susceptible they are.

## 2014-03-12 ENCOUNTER — Telehealth (HOSPITAL_COMMUNITY): Payer: Self-pay | Admitting: Psychiatry

## 2014-03-12 ENCOUNTER — Other Ambulatory Visit (HOSPITAL_COMMUNITY): Payer: No Typology Code available for payment source

## 2014-03-12 NOTE — Progress Notes (Signed)
    Daily Group Progress Note  Program: IOP  Group Time: 9:00-10:30  Participation Level: Active  Behavioral Response: Appropriate  Type of Therapy:  Group Therapy  Summary of Progress: Pt. Reports that he missed group yesterday due to problems with transportation. Pt. Reports that he went hunting over the weekend. Pt. Reports that he rode his 4 wheeler and that he is feeling much better. Pt. Reported that he is "beginning to see the light at the end of the tunnel", presents with brighter affect and reports hopeful mood.     Group Time: 10:30-12:00  Participation Level:  Active  Behavioral Response: Appropriate  Type of Therapy: Psycho-education Group  Summary of Progress: Pt. Participated in wheel of life exercise.   Nancie Neas, LPC

## 2014-03-13 ENCOUNTER — Other Ambulatory Visit (HOSPITAL_COMMUNITY): Payer: No Typology Code available for payment source | Admitting: Psychiatry

## 2014-03-13 DIAGNOSIS — F329 Major depressive disorder, single episode, unspecified: Secondary | ICD-10-CM | POA: Diagnosis not present

## 2014-03-13 DIAGNOSIS — F331 Major depressive disorder, recurrent, moderate: Secondary | ICD-10-CM

## 2014-03-13 NOTE — Progress Notes (Signed)
    Daily Group Progress Note  Program: IOP  Group Time: 9:00-10:30  Participation Level: Active  Behavioral Response: Appropriate  Type of Therapy:  Group Therapy  Summary of Progress: Pt. Reported that he missed group yesterday due to transportation problems. Pt.'s speech continues to be slurred but is talkative and presents with brightened affect. Pt. Reports that "light at the end of the tunnel gets brighter and brighter".     Group Time: 10:30-12:00  Participation Level:  Active  Behavioral Response: Appropriate  Type of Therapy: Psycho-education Group  Summary of Progress: Pt. Participated in discussion about developing motivation. Pt. Watched and discussed Mel Robbins video.   Nancie Neas, LPC

## 2014-03-16 ENCOUNTER — Other Ambulatory Visit (HOSPITAL_COMMUNITY): Payer: No Typology Code available for payment source | Admitting: Psychiatry

## 2014-03-17 ENCOUNTER — Other Ambulatory Visit (HOSPITAL_COMMUNITY): Payer: No Typology Code available for payment source | Admitting: Psychiatry

## 2014-03-18 ENCOUNTER — Other Ambulatory Visit (HOSPITAL_COMMUNITY): Payer: No Typology Code available for payment source | Admitting: Psychiatry

## 2014-03-18 DIAGNOSIS — F331 Major depressive disorder, recurrent, moderate: Secondary | ICD-10-CM

## 2014-03-18 DIAGNOSIS — F329 Major depressive disorder, single episode, unspecified: Secondary | ICD-10-CM | POA: Diagnosis not present

## 2014-03-18 NOTE — Patient Instructions (Addendum)
Patient completed MH-IOP today.  Will follow up with Adolph Pollack, NP on 04-20-14 @ 12 noon and Lyn Norris, LCSW on 03-23-14 @ 8 a.m.  Encouraged support groups.

## 2014-03-18 NOTE — Progress Notes (Signed)
Jamie Belger is a 63 y.o. , married, retired, Caucasian male, who was referred per [facility]. Pt stated he was admitted there for six days due to depression, anxiety symptoms with SI. Continued to report passive SI, but is able to contract for safety. Pt denied HI or A/V hallucinations. Other symptoms include: Tearfulness, poor concentration, fatigue, anhedonia, increased sleep, isolation, no motivation, ruminating thoughts, feelings of hopelessness, helplessness and worthlessness. Triggers/Stressors: 1) Unresolved grief/loss issues: Pt reports that he had prostate surgery approx 4 months ago and the procedure left him feeling inferior and less of man-"my back is against the wall and there's no more fight left in me". He said that his physical and mental functioning continued to decompensate since the surgery and he is especially having a hard time coping with the recovery time from his surgery. Also, patient was grieving the loss of his mother two years ago. She died of cancer. Prior to being admitted at [facility]; pt was admitted at [facility] in [place] twenty plus yrs ago. Denied any prior suicide attempts or gestures. Has been seeing Dr. Reece Levy for medication mgmt for years. Has seen Lyn Norris, LCSW, three times. Family Hx: Sister (depression). Childhood: Pt was born and raised in [place], [place]. Reports a "normal" childhood. Denies trauma or abuse. Siblings: One older and one younger sister.  Kids: 20 yr old daughter and 42 yr old son. Has been married to supportive wife for forty-five years. Pt retired three yrs ago from [employer]. Reports no difficulty with retirement. Denied drugs/ETOH, cigarettes, past DUI's, or legal issues. Stated his support system included wife and kids. Pt completed MH-IOP today.  Reports feeling much better.  States he isn't struggling with any depressive nor anxiety symptoms.  Denies SI/HI or A/V hallucinations.  A:  D/C  today.  F/U with Adolph Pollack, NP on 04-20-14 @ 12 noon and Lyn Veverly Fells, [place] on 03-23-14 @ 8 am.  Encouraged support groups.  R:  Pt receptive.

## 2014-03-18 NOTE — Progress Notes (Signed)
  [place] [facility] Intensive Outpatient Program Discharge Summary  Edward Johnston [id number]  Admission date: 02/24/2014 Discharge date: 03/18/2014  Reason for admission: depression  Chemical Use History:  Not an issue  Family of Origin Issues: none  Progress in Program Toward Treatment Goals: Feels much better he says with a smile "I feel like my old self"  Says he is looking forward to things, doing activities he enjoys, not crying  Progress (rationale): He says the "meshing" with the group helped the most and by that he means the interaction with the group members.  I think the medications have also had a chance to work by now.    Clarene Reamer, MD 03/18/2014

## 2014-03-19 ENCOUNTER — Other Ambulatory Visit (HOSPITAL_COMMUNITY): Payer: No Typology Code available for payment source

## 2014-03-19 NOTE — Progress Notes (Signed)
    Daily Group Progress Note  Program: IOP  Group Time: 9:00-10:30  Participation Level: Active  Behavioral Response: Appropriate  Type of Therapy:  Group Therapy  Summary of Progress: Pt. Prepared for discharge from group. Pt. Reported that he was feeling much better, balance and slurred speech have improved, and continues to feel hopeful about his future.      Group Time: 10:30-12:00  Participation Level:  None  Behavioral Response: Appropriate  Type of Therapy: Psycho-education Group  Summary of Progress: Pt. Did not attend second half of group.   Clarene Reamer, MD

## 2014-03-20 ENCOUNTER — Other Ambulatory Visit (HOSPITAL_COMMUNITY): Payer: No Typology Code available for payment source

## 2014-03-23 ENCOUNTER — Other Ambulatory Visit (HOSPITAL_COMMUNITY): Payer: No Typology Code available for payment source

## 2014-03-24 ENCOUNTER — Other Ambulatory Visit (HOSPITAL_COMMUNITY): Payer: No Typology Code available for payment source

## 2014-03-25 ENCOUNTER — Other Ambulatory Visit (HOSPITAL_COMMUNITY): Payer: No Typology Code available for payment source

## 2014-03-26 ENCOUNTER — Other Ambulatory Visit (HOSPITAL_COMMUNITY): Payer: No Typology Code available for payment source

## 2014-03-30 ENCOUNTER — Other Ambulatory Visit (HOSPITAL_COMMUNITY): Payer: No Typology Code available for payment source

## 2014-03-31 ENCOUNTER — Other Ambulatory Visit (HOSPITAL_COMMUNITY): Payer: No Typology Code available for payment source

## 2014-04-01 ENCOUNTER — Other Ambulatory Visit (HOSPITAL_COMMUNITY): Payer: No Typology Code available for payment source

## 2014-04-02 ENCOUNTER — Other Ambulatory Visit (HOSPITAL_COMMUNITY): Payer: No Typology Code available for payment source

## 2014-04-06 ENCOUNTER — Other Ambulatory Visit (HOSPITAL_COMMUNITY): Payer: MEDICARE

## 2014-09-17 DIAGNOSIS — N529 Male erectile dysfunction, unspecified: Secondary | ICD-10-CM | POA: Insufficient documentation

## 2015-04-21 DIAGNOSIS — C61 Malignant neoplasm of prostate: Secondary | ICD-10-CM | POA: Diagnosis not present

## 2015-04-26 DIAGNOSIS — J0100 Acute maxillary sinusitis, unspecified: Secondary | ICD-10-CM | POA: Diagnosis not present

## 2015-04-28 DIAGNOSIS — C61 Malignant neoplasm of prostate: Secondary | ICD-10-CM | POA: Diagnosis not present

## 2015-04-28 DIAGNOSIS — N529 Male erectile dysfunction, unspecified: Secondary | ICD-10-CM | POA: Diagnosis not present

## 2015-05-10 DIAGNOSIS — H40052 Ocular hypertension, left eye: Secondary | ICD-10-CM | POA: Diagnosis not present

## 2015-05-13 DIAGNOSIS — J189 Pneumonia, unspecified organism: Secondary | ICD-10-CM | POA: Diagnosis not present

## 2015-06-15 DIAGNOSIS — F330 Major depressive disorder, recurrent, mild: Secondary | ICD-10-CM | POA: Diagnosis not present

## 2015-06-15 DIAGNOSIS — F411 Generalized anxiety disorder: Secondary | ICD-10-CM | POA: Diagnosis not present

## 2015-06-16 ENCOUNTER — Inpatient Hospital Stay (HOSPITAL_COMMUNITY)
Admission: AD | Admit: 2015-06-16 | Payer: Self-pay | Source: Other Acute Inpatient Hospital | Admitting: Pulmonary Disease

## 2015-06-16 DIAGNOSIS — I519 Heart disease, unspecified: Secondary | ICD-10-CM | POA: Diagnosis not present

## 2015-06-16 DIAGNOSIS — R05 Cough: Secondary | ICD-10-CM | POA: Diagnosis not present

## 2015-06-16 DIAGNOSIS — R0902 Hypoxemia: Secondary | ICD-10-CM | POA: Diagnosis not present

## 2015-06-16 DIAGNOSIS — R509 Fever, unspecified: Secondary | ICD-10-CM | POA: Diagnosis not present

## 2015-06-16 DIAGNOSIS — Z79899 Other long term (current) drug therapy: Secondary | ICD-10-CM | POA: Diagnosis not present

## 2015-06-16 DIAGNOSIS — Z8249 Family history of ischemic heart disease and other diseases of the circulatory system: Secondary | ICD-10-CM | POA: Diagnosis not present

## 2015-06-16 DIAGNOSIS — I2602 Saddle embolus of pulmonary artery with acute cor pulmonale: Secondary | ICD-10-CM | POA: Diagnosis not present

## 2015-06-16 DIAGNOSIS — I10 Essential (primary) hypertension: Secondary | ICD-10-CM | POA: Diagnosis not present

## 2015-06-16 DIAGNOSIS — I2699 Other pulmonary embolism without acute cor pulmonale: Secondary | ICD-10-CM | POA: Diagnosis not present

## 2015-06-16 DIAGNOSIS — R079 Chest pain, unspecified: Secondary | ICD-10-CM | POA: Diagnosis not present

## 2015-06-16 DIAGNOSIS — R0602 Shortness of breath: Secondary | ICD-10-CM | POA: Diagnosis not present

## 2015-06-17 DIAGNOSIS — Z8546 Personal history of malignant neoplasm of prostate: Secondary | ICD-10-CM

## 2015-06-17 DIAGNOSIS — R079 Chest pain, unspecified: Secondary | ICD-10-CM | POA: Diagnosis not present

## 2015-06-17 DIAGNOSIS — Z8679 Personal history of other diseases of the circulatory system: Secondary | ICD-10-CM

## 2015-06-17 DIAGNOSIS — Z96659 Presence of unspecified artificial knee joint: Secondary | ICD-10-CM | POA: Diagnosis not present

## 2015-06-17 DIAGNOSIS — I2602 Saddle embolus of pulmonary artery with acute cor pulmonale: Secondary | ICD-10-CM

## 2015-06-17 DIAGNOSIS — E669 Obesity, unspecified: Secondary | ICD-10-CM | POA: Diagnosis not present

## 2015-06-17 DIAGNOSIS — F329 Major depressive disorder, single episode, unspecified: Secondary | ICD-10-CM | POA: Diagnosis not present

## 2015-06-17 DIAGNOSIS — I10 Essential (primary) hypertension: Secondary | ICD-10-CM | POA: Diagnosis not present

## 2015-06-17 DIAGNOSIS — F419 Anxiety disorder, unspecified: Secondary | ICD-10-CM | POA: Diagnosis not present

## 2015-06-17 DIAGNOSIS — Z9889 Other specified postprocedural states: Secondary | ICD-10-CM

## 2015-06-17 DIAGNOSIS — I82432 Acute embolism and thrombosis of left popliteal vein: Secondary | ICD-10-CM | POA: Diagnosis not present

## 2015-06-17 DIAGNOSIS — I82412 Acute embolism and thrombosis of left femoral vein: Secondary | ICD-10-CM | POA: Diagnosis not present

## 2015-06-17 DIAGNOSIS — I5030 Unspecified diastolic (congestive) heart failure: Secondary | ICD-10-CM | POA: Diagnosis not present

## 2015-06-17 DIAGNOSIS — I82442 Acute embolism and thrombosis of left tibial vein: Secondary | ICD-10-CM | POA: Diagnosis not present

## 2015-06-17 DIAGNOSIS — Z8711 Personal history of peptic ulcer disease: Secondary | ICD-10-CM | POA: Diagnosis not present

## 2015-06-17 DIAGNOSIS — R0602 Shortness of breath: Secondary | ICD-10-CM | POA: Diagnosis not present

## 2015-06-17 DIAGNOSIS — I2692 Saddle embolus of pulmonary artery without acute cor pulmonale: Secondary | ICD-10-CM | POA: Diagnosis not present

## 2015-06-17 DIAGNOSIS — J9601 Acute respiratory failure with hypoxia: Secondary | ICD-10-CM | POA: Diagnosis not present

## 2015-06-17 DIAGNOSIS — Z9079 Acquired absence of other genital organ(s): Secondary | ICD-10-CM | POA: Diagnosis not present

## 2015-06-17 HISTORY — DX: Personal history of other diseases of the circulatory system: Z86.79

## 2015-06-17 HISTORY — DX: Saddle embolus of pulmonary artery with acute cor pulmonale: I26.02

## 2015-06-17 HISTORY — DX: Personal history of other diseases of the circulatory system: Z98.890

## 2015-06-17 HISTORY — DX: Personal history of malignant neoplasm of prostate: Z85.46

## 2015-06-18 DIAGNOSIS — I2602 Saddle embolus of pulmonary artery with acute cor pulmonale: Secondary | ICD-10-CM | POA: Diagnosis not present

## 2015-06-19 DIAGNOSIS — J9601 Acute respiratory failure with hypoxia: Secondary | ICD-10-CM | POA: Diagnosis not present

## 2015-06-19 DIAGNOSIS — F418 Other specified anxiety disorders: Secondary | ICD-10-CM | POA: Diagnosis not present

## 2015-06-19 DIAGNOSIS — I10 Essential (primary) hypertension: Secondary | ICD-10-CM | POA: Diagnosis not present

## 2015-06-19 DIAGNOSIS — I2602 Saddle embolus of pulmonary artery with acute cor pulmonale: Secondary | ICD-10-CM | POA: Diagnosis not present

## 2015-06-20 DIAGNOSIS — J9601 Acute respiratory failure with hypoxia: Secondary | ICD-10-CM | POA: Diagnosis not present

## 2015-06-20 DIAGNOSIS — I10 Essential (primary) hypertension: Secondary | ICD-10-CM | POA: Diagnosis not present

## 2015-06-20 DIAGNOSIS — F418 Other specified anxiety disorders: Secondary | ICD-10-CM | POA: Diagnosis not present

## 2015-06-20 DIAGNOSIS — I2602 Saddle embolus of pulmonary artery with acute cor pulmonale: Secondary | ICD-10-CM | POA: Diagnosis not present

## 2015-06-28 DIAGNOSIS — E041 Nontoxic single thyroid nodule: Secondary | ICD-10-CM | POA: Diagnosis not present

## 2015-06-28 DIAGNOSIS — I2692 Saddle embolus of pulmonary artery without acute cor pulmonale: Secondary | ICD-10-CM | POA: Diagnosis not present

## 2015-06-28 DIAGNOSIS — G479 Sleep disorder, unspecified: Secondary | ICD-10-CM | POA: Diagnosis not present

## 2015-06-28 DIAGNOSIS — Z9181 History of falling: Secondary | ICD-10-CM | POA: Diagnosis not present

## 2015-06-28 DIAGNOSIS — Z Encounter for general examination without complaint, suspected or reported diagnosis: Secondary | ICD-10-CM | POA: Diagnosis not present

## 2015-07-05 DIAGNOSIS — E041 Nontoxic single thyroid nodule: Secondary | ICD-10-CM | POA: Diagnosis not present

## 2015-07-05 DIAGNOSIS — Z803 Family history of malignant neoplasm of breast: Secondary | ICD-10-CM | POA: Diagnosis not present

## 2015-07-05 DIAGNOSIS — Z7901 Long term (current) use of anticoagulants: Secondary | ICD-10-CM | POA: Diagnosis not present

## 2015-07-05 DIAGNOSIS — C61 Malignant neoplasm of prostate: Secondary | ICD-10-CM | POA: Diagnosis not present

## 2015-07-05 DIAGNOSIS — I2692 Saddle embolus of pulmonary artery without acute cor pulmonale: Secondary | ICD-10-CM | POA: Diagnosis not present

## 2015-07-05 DIAGNOSIS — Z8546 Personal history of malignant neoplasm of prostate: Secondary | ICD-10-CM | POA: Diagnosis not present

## 2015-07-08 DIAGNOSIS — H40052 Ocular hypertension, left eye: Secondary | ICD-10-CM | POA: Diagnosis not present

## 2015-07-09 DIAGNOSIS — G4733 Obstructive sleep apnea (adult) (pediatric): Secondary | ICD-10-CM | POA: Diagnosis not present

## 2015-07-13 DIAGNOSIS — Z7901 Long term (current) use of anticoagulants: Secondary | ICD-10-CM | POA: Diagnosis not present

## 2015-07-13 DIAGNOSIS — I2692 Saddle embolus of pulmonary artery without acute cor pulmonale: Secondary | ICD-10-CM | POA: Diagnosis not present

## 2015-09-01 DIAGNOSIS — I10 Essential (primary) hypertension: Secondary | ICD-10-CM | POA: Diagnosis not present

## 2015-09-01 DIAGNOSIS — R000 Tachycardia, unspecified: Secondary | ICD-10-CM | POA: Diagnosis not present

## 2015-09-01 DIAGNOSIS — Z7902 Long term (current) use of antithrombotics/antiplatelets: Secondary | ICD-10-CM | POA: Diagnosis not present

## 2015-09-01 DIAGNOSIS — Z7901 Long term (current) use of anticoagulants: Secondary | ICD-10-CM | POA: Diagnosis not present

## 2015-09-01 DIAGNOSIS — I4892 Unspecified atrial flutter: Secondary | ICD-10-CM | POA: Diagnosis not present

## 2015-09-01 DIAGNOSIS — Z86711 Personal history of pulmonary embolism: Secondary | ICD-10-CM | POA: Diagnosis not present

## 2015-09-01 DIAGNOSIS — Z7982 Long term (current) use of aspirin: Secondary | ICD-10-CM | POA: Diagnosis not present

## 2015-09-01 DIAGNOSIS — I517 Cardiomegaly: Secondary | ICD-10-CM | POA: Diagnosis not present

## 2015-09-01 DIAGNOSIS — I272 Other secondary pulmonary hypertension: Secondary | ICD-10-CM | POA: Diagnosis not present

## 2015-09-01 DIAGNOSIS — R079 Chest pain, unspecified: Secondary | ICD-10-CM | POA: Diagnosis not present

## 2015-09-01 DIAGNOSIS — F418 Other specified anxiety disorders: Secondary | ICD-10-CM | POA: Diagnosis not present

## 2015-09-01 DIAGNOSIS — R918 Other nonspecific abnormal finding of lung field: Secondary | ICD-10-CM | POA: Diagnosis not present

## 2015-09-01 DIAGNOSIS — R030 Elevated blood-pressure reading, without diagnosis of hypertension: Secondary | ICD-10-CM | POA: Diagnosis not present

## 2015-09-01 DIAGNOSIS — I483 Typical atrial flutter: Secondary | ICD-10-CM | POA: Diagnosis not present

## 2015-09-01 DIAGNOSIS — R0602 Shortness of breath: Secondary | ICD-10-CM | POA: Diagnosis not present

## 2015-09-01 DIAGNOSIS — Z79899 Other long term (current) drug therapy: Secondary | ICD-10-CM | POA: Diagnosis not present

## 2015-09-01 DIAGNOSIS — I2699 Other pulmonary embolism without acute cor pulmonale: Secondary | ICD-10-CM | POA: Diagnosis not present

## 2015-09-02 DIAGNOSIS — I483 Typical atrial flutter: Secondary | ICD-10-CM | POA: Diagnosis not present

## 2015-09-02 DIAGNOSIS — R918 Other nonspecific abnormal finding of lung field: Secondary | ICD-10-CM | POA: Diagnosis not present

## 2015-09-02 DIAGNOSIS — I4892 Unspecified atrial flutter: Secondary | ICD-10-CM | POA: Diagnosis not present

## 2015-09-02 DIAGNOSIS — I517 Cardiomegaly: Secondary | ICD-10-CM | POA: Diagnosis not present

## 2015-09-02 DIAGNOSIS — I340 Nonrheumatic mitral (valve) insufficiency: Secondary | ICD-10-CM | POA: Diagnosis not present

## 2015-09-02 DIAGNOSIS — I2699 Other pulmonary embolism without acute cor pulmonale: Secondary | ICD-10-CM | POA: Diagnosis not present

## 2015-09-02 DIAGNOSIS — R9431 Abnormal electrocardiogram [ECG] [EKG]: Secondary | ICD-10-CM | POA: Diagnosis not present

## 2015-09-02 DIAGNOSIS — Z7901 Long term (current) use of anticoagulants: Secondary | ICD-10-CM | POA: Diagnosis not present

## 2015-09-03 DIAGNOSIS — I4892 Unspecified atrial flutter: Secondary | ICD-10-CM | POA: Diagnosis not present

## 2015-09-03 DIAGNOSIS — Z7901 Long term (current) use of anticoagulants: Secondary | ICD-10-CM | POA: Diagnosis not present

## 2015-09-03 DIAGNOSIS — R918 Other nonspecific abnormal finding of lung field: Secondary | ICD-10-CM | POA: Diagnosis not present

## 2015-09-03 DIAGNOSIS — I2699 Other pulmonary embolism without acute cor pulmonale: Secondary | ICD-10-CM | POA: Diagnosis not present

## 2015-09-03 DIAGNOSIS — R000 Tachycardia, unspecified: Secondary | ICD-10-CM | POA: Diagnosis not present

## 2015-09-03 DIAGNOSIS — I483 Typical atrial flutter: Secondary | ICD-10-CM | POA: Diagnosis not present

## 2015-09-04 DIAGNOSIS — Z79899 Other long term (current) drug therapy: Secondary | ICD-10-CM | POA: Diagnosis not present

## 2015-09-04 DIAGNOSIS — I272 Other secondary pulmonary hypertension: Secondary | ICD-10-CM | POA: Diagnosis not present

## 2015-09-04 DIAGNOSIS — Z7901 Long term (current) use of anticoagulants: Secondary | ICD-10-CM | POA: Diagnosis not present

## 2015-09-04 DIAGNOSIS — I480 Paroxysmal atrial fibrillation: Secondary | ICD-10-CM | POA: Diagnosis not present

## 2015-09-04 DIAGNOSIS — I2699 Other pulmonary embolism without acute cor pulmonale: Secondary | ICD-10-CM

## 2015-09-04 DIAGNOSIS — I483 Typical atrial flutter: Secondary | ICD-10-CM | POA: Diagnosis not present

## 2015-09-04 DIAGNOSIS — R918 Other nonspecific abnormal finding of lung field: Secondary | ICD-10-CM

## 2015-09-04 DIAGNOSIS — R000 Tachycardia, unspecified: Secondary | ICD-10-CM | POA: Diagnosis not present

## 2015-09-04 DIAGNOSIS — Z86711 Personal history of pulmonary embolism: Secondary | ICD-10-CM | POA: Diagnosis not present

## 2015-09-04 DIAGNOSIS — F418 Other specified anxiety disorders: Secondary | ICD-10-CM | POA: Diagnosis not present

## 2015-09-04 DIAGNOSIS — I10 Essential (primary) hypertension: Secondary | ICD-10-CM | POA: Diagnosis not present

## 2015-09-04 DIAGNOSIS — Z7982 Long term (current) use of aspirin: Secondary | ICD-10-CM | POA: Diagnosis not present

## 2015-09-04 DIAGNOSIS — I4892 Unspecified atrial flutter: Secondary | ICD-10-CM | POA: Diagnosis not present

## 2015-09-04 DIAGNOSIS — Z7902 Long term (current) use of antithrombotics/antiplatelets: Secondary | ICD-10-CM | POA: Diagnosis not present

## 2015-09-09 DIAGNOSIS — I4892 Unspecified atrial flutter: Secondary | ICD-10-CM | POA: Diagnosis not present

## 2015-09-09 DIAGNOSIS — G4733 Obstructive sleep apnea (adult) (pediatric): Secondary | ICD-10-CM | POA: Diagnosis not present

## 2015-09-09 DIAGNOSIS — R918 Other nonspecific abnormal finding of lung field: Secondary | ICD-10-CM | POA: Diagnosis not present

## 2015-09-13 DIAGNOSIS — Z86711 Personal history of pulmonary embolism: Secondary | ICD-10-CM

## 2015-09-13 DIAGNOSIS — I4892 Unspecified atrial flutter: Secondary | ICD-10-CM | POA: Insufficient documentation

## 2015-09-13 DIAGNOSIS — I471 Supraventricular tachycardia: Secondary | ICD-10-CM | POA: Diagnosis not present

## 2015-09-13 DIAGNOSIS — R918 Other nonspecific abnormal finding of lung field: Secondary | ICD-10-CM | POA: Diagnosis not present

## 2015-09-13 HISTORY — DX: Personal history of pulmonary embolism: Z86.711

## 2015-09-13 HISTORY — DX: Unspecified atrial flutter: I48.92

## 2015-09-14 DIAGNOSIS — I2699 Other pulmonary embolism without acute cor pulmonale: Secondary | ICD-10-CM | POA: Diagnosis not present

## 2015-09-14 DIAGNOSIS — F411 Generalized anxiety disorder: Secondary | ICD-10-CM | POA: Diagnosis not present

## 2015-09-14 DIAGNOSIS — R918 Other nonspecific abnormal finding of lung field: Secondary | ICD-10-CM | POA: Diagnosis not present

## 2015-09-14 DIAGNOSIS — G4733 Obstructive sleep apnea (adult) (pediatric): Secondary | ICD-10-CM | POA: Diagnosis not present

## 2015-09-14 DIAGNOSIS — F330 Major depressive disorder, recurrent, mild: Secondary | ICD-10-CM | POA: Diagnosis not present

## 2015-09-14 DIAGNOSIS — R0602 Shortness of breath: Secondary | ICD-10-CM | POA: Diagnosis not present

## 2015-09-29 DIAGNOSIS — R599 Enlarged lymph nodes, unspecified: Secondary | ICD-10-CM | POA: Diagnosis not present

## 2015-09-29 DIAGNOSIS — R591 Generalized enlarged lymph nodes: Secondary | ICD-10-CM | POA: Diagnosis not present

## 2015-09-29 DIAGNOSIS — C7989 Secondary malignant neoplasm of other specified sites: Secondary | ICD-10-CM | POA: Diagnosis not present

## 2015-09-29 DIAGNOSIS — E049 Nontoxic goiter, unspecified: Secondary | ICD-10-CM | POA: Diagnosis not present

## 2015-09-29 DIAGNOSIS — C3492 Malignant neoplasm of unspecified part of left bronchus or lung: Secondary | ICD-10-CM | POA: Diagnosis not present

## 2015-09-30 DIAGNOSIS — E669 Obesity, unspecified: Secondary | ICD-10-CM | POA: Diagnosis not present

## 2015-09-30 DIAGNOSIS — I2699 Other pulmonary embolism without acute cor pulmonale: Secondary | ICD-10-CM | POA: Diagnosis not present

## 2015-09-30 DIAGNOSIS — R590 Localized enlarged lymph nodes: Secondary | ICD-10-CM | POA: Diagnosis not present

## 2015-09-30 DIAGNOSIS — R918 Other nonspecific abnormal finding of lung field: Secondary | ICD-10-CM | POA: Diagnosis not present

## 2015-10-04 DIAGNOSIS — R599 Enlarged lymph nodes, unspecified: Secondary | ICD-10-CM | POA: Diagnosis not present

## 2015-10-04 DIAGNOSIS — C8293 Follicular lymphoma, unspecified, intra-abdominal lymph nodes: Secondary | ICD-10-CM | POA: Diagnosis not present

## 2015-10-04 DIAGNOSIS — R590 Localized enlarged lymph nodes: Secondary | ICD-10-CM | POA: Diagnosis not present

## 2015-10-06 DIAGNOSIS — R590 Localized enlarged lymph nodes: Secondary | ICD-10-CM | POA: Insufficient documentation

## 2015-10-06 HISTORY — DX: Localized enlarged lymph nodes: R59.0

## 2015-10-08 DIAGNOSIS — R918 Other nonspecific abnormal finding of lung field: Secondary | ICD-10-CM

## 2015-10-08 DIAGNOSIS — R590 Localized enlarged lymph nodes: Secondary | ICD-10-CM | POA: Diagnosis not present

## 2015-10-08 DIAGNOSIS — C8305 Small cell B-cell lymphoma, lymph nodes of inguinal region and lower limb: Secondary | ICD-10-CM | POA: Diagnosis not present

## 2015-10-08 HISTORY — DX: Other nonspecific abnormal finding of lung field: R91.8

## 2015-10-10 DIAGNOSIS — C8305 Small cell B-cell lymphoma, lymph nodes of inguinal region and lower limb: Secondary | ICD-10-CM | POA: Insufficient documentation

## 2015-10-10 HISTORY — DX: Small cell b-cell lymphoma, lymph nodes of inguinal region and lower limb: C83.05

## 2015-10-11 DIAGNOSIS — R42 Dizziness and giddiness: Secondary | ICD-10-CM | POA: Diagnosis not present

## 2015-10-11 DIAGNOSIS — L309 Dermatitis, unspecified: Secondary | ICD-10-CM | POA: Diagnosis not present

## 2015-10-13 DIAGNOSIS — I4892 Unspecified atrial flutter: Secondary | ICD-10-CM | POA: Diagnosis not present

## 2015-10-13 DIAGNOSIS — R918 Other nonspecific abnormal finding of lung field: Secondary | ICD-10-CM | POA: Diagnosis not present

## 2015-10-13 DIAGNOSIS — Z86711 Personal history of pulmonary embolism: Secondary | ICD-10-CM | POA: Diagnosis not present

## 2015-10-14 DIAGNOSIS — E669 Obesity, unspecified: Secondary | ICD-10-CM | POA: Diagnosis not present

## 2015-10-14 DIAGNOSIS — C8590 Non-Hodgkin lymphoma, unspecified, unspecified site: Secondary | ICD-10-CM | POA: Diagnosis not present

## 2015-10-14 DIAGNOSIS — R918 Other nonspecific abnormal finding of lung field: Secondary | ICD-10-CM | POA: Diagnosis not present

## 2015-10-20 DIAGNOSIS — C8305 Small cell B-cell lymphoma, lymph nodes of inguinal region and lower limb: Secondary | ICD-10-CM | POA: Diagnosis not present

## 2015-10-20 DIAGNOSIS — R590 Localized enlarged lymph nodes: Secondary | ICD-10-CM | POA: Diagnosis not present

## 2015-10-20 DIAGNOSIS — R918 Other nonspecific abnormal finding of lung field: Secondary | ICD-10-CM | POA: Diagnosis not present

## 2015-10-22 DIAGNOSIS — I2699 Other pulmonary embolism without acute cor pulmonale: Secondary | ICD-10-CM | POA: Diagnosis not present

## 2015-10-22 DIAGNOSIS — R918 Other nonspecific abnormal finding of lung field: Secondary | ICD-10-CM | POA: Diagnosis not present

## 2015-10-27 DIAGNOSIS — C8305 Small cell B-cell lymphoma, lymph nodes of inguinal region and lower limb: Secondary | ICD-10-CM | POA: Diagnosis not present

## 2015-10-27 DIAGNOSIS — R531 Weakness: Secondary | ICD-10-CM | POA: Diagnosis not present

## 2015-10-27 DIAGNOSIS — R4789 Other speech disturbances: Secondary | ICD-10-CM | POA: Diagnosis not present

## 2015-10-27 DIAGNOSIS — Z9889 Other specified postprocedural states: Secondary | ICD-10-CM | POA: Diagnosis not present

## 2015-10-27 DIAGNOSIS — R479 Unspecified speech disturbances: Secondary | ICD-10-CM | POA: Diagnosis not present

## 2015-10-28 DIAGNOSIS — C8595 Non-Hodgkin lymphoma, unspecified, lymph nodes of inguinal region and lower limb: Secondary | ICD-10-CM | POA: Diagnosis not present

## 2015-10-28 DIAGNOSIS — R918 Other nonspecific abnormal finding of lung field: Secondary | ICD-10-CM | POA: Diagnosis not present

## 2015-10-28 DIAGNOSIS — J984 Other disorders of lung: Secondary | ICD-10-CM | POA: Diagnosis not present

## 2015-11-04 DIAGNOSIS — H2513 Age-related nuclear cataract, bilateral: Secondary | ICD-10-CM | POA: Diagnosis not present

## 2015-11-04 DIAGNOSIS — H524 Presbyopia: Secondary | ICD-10-CM | POA: Diagnosis not present

## 2015-11-04 DIAGNOSIS — H40052 Ocular hypertension, left eye: Secondary | ICD-10-CM | POA: Diagnosis not present

## 2015-11-09 ENCOUNTER — Institutional Professional Consult (permissible substitution) (INDEPENDENT_AMBULATORY_CARE_PROVIDER_SITE_OTHER): Payer: PPO | Admitting: Cardiothoracic Surgery

## 2015-11-09 ENCOUNTER — Encounter: Payer: Self-pay | Admitting: Cardiothoracic Surgery

## 2015-11-09 VITALS — BP 134/92 | HR 99 | Resp 16 | Ht 74.0 in | Wt 290.0 lb

## 2015-11-09 DIAGNOSIS — R222 Localized swelling, mass and lump, trunk: Secondary | ICD-10-CM

## 2015-11-09 DIAGNOSIS — F329 Major depressive disorder, single episode, unspecified: Secondary | ICD-10-CM | POA: Insufficient documentation

## 2015-11-09 DIAGNOSIS — R918 Other nonspecific abnormal finding of lung field: Secondary | ICD-10-CM

## 2015-11-09 DIAGNOSIS — F419 Anxiety disorder, unspecified: Secondary | ICD-10-CM

## 2015-11-09 DIAGNOSIS — F32A Depression, unspecified: Secondary | ICD-10-CM

## 2015-11-09 HISTORY — DX: Major depressive disorder, single episode, unspecified: F32.9

## 2015-11-09 HISTORY — DX: Anxiety disorder, unspecified: F41.9

## 2015-11-09 HISTORY — DX: Depression, unspecified: F32.A

## 2015-11-09 NOTE — Progress Notes (Signed)
[address]       [place] [address]             [phone]                    Edward Johnston [place] Medical Record #[id number] Date of Birth: [birth year]  Referring: Edward Sheriff, MD Primary Care: [facility] Edward Johnston., MD  Chief Complaint:    Chief Complaint  Patient presents with  . Lung Mass    HILAR.Marland KitchenMarland KitchenCT CHEST/PET...request EBUS  . Lymphoma    per BX of inguinal node  . Adenopathy    History of Present Illness:    Edward Johnston 65 y.o. male is seen in the office  today for consideration for bronchoscopy and ebus with transbronchial biopsy of mediastinal nodes. Patient was recently diagnosed with a low-grade lymphoma from a right groin biopsy. He presented to [facility] in March 2017 with saddle pulmonary embolus confirmed on CT scan . A second CT scan of the chest was done in May 2017 when he presented for routine visit to primary care was noted to be tachycardic, on this follow-up scan is noted to have a left hilar mass since we underwent bronchoscopy done by Dr Glade Lloyd in [facility]. No endobronchial lesions were noted, the patient's Eliquist  was resumed.. On 628 he had a PET scan which showed left hypermetabolic suprahilar mass suggestive of primary carcinoma the lung and ipsilateral nodal metastasis to the left lower paratracheal nodes is also noted to have bilateral mildly hypermanic Aulik axillary nodes on July 3 needle biopsy was performed a right inguinal node revealing follicular lymphoma low-grade.    The patient's overall activity level is extremely low spending more than 75% of his time sitting in the chair at home. He notes he gets short of breath with minimal activity such as just walking around in the yard. , He's been seen by cardiology when he had presented several months ago with rapid atrial fibrillation. He has no history of known cardiac disease previously  Patient is a lifelong nonsmoker    Edward Ghazi, MD -  10/28/2015 8:22 AM EDT Procedure: Bronchoscopy with endobronchial biopsy of the left upper lobe along with washing of the left upper lobe Indication: 65 year old male who is being evaluated for a hilar mass density that is positive on PET scan patient was recently diagnosed with lymphoma from a inguinal biopsy Performed by ; Edward Ghazi MD Anesthetic: Versed 4 mg IV, morphine 8 mg IV and instillation of 2% and 1% lidocaine via bronchoscope Findings: Bronchoscope was passed on patient right nasal orifice down to the nasal passage. Vocal cords were visualized and appears to be normal. Vocal cords was anesthetized with 2% lidocaine. After the vocal cords were properly anesthetized bronchoscope passed beyond the vocal cords down to patient trachea. Rest of the airway was anesthetized with 1% lidocaine. As noted trachea appears to be normal. Carina appears to be normal. Subsequently bronchoscope was passed down the right tracheobronchial tree the right mainstem appears to be normal. Right upper lobe apical anterior and posterior segment were visualized and appears to be normal. Rhonchus intermedius was normal. Right middle lobe and its segments were visualized and appears be normal. Right lower lobe and all its basal segments and including superior segment of the right upper lobe on appears be normal there was no loss mass lesion seen. Subsequently the bronchoscope was passed down the left tracheobronchial tree. Left mainstem appears  to be normal. Left upper lobe was visualized and appears to be normal except there was a regular rate noted in that the orifice of the left upper lobe. The lingula lobe was normal. The left lower lobe and is being sent on appears be normal. Subsequently washes was performed of the left tracheobronchial tree particularly the left upper lobe. The area of radiation that was noted (see illustration) was biopsy. Specimens was collected at collected bronchoscope was removed from patient's  airway. Specimen: All being sent for cytology and passed Impression: Left hilar density that was positive on PET scan with essentially normal bronchoscopy examination.        Current Activity/ Functional Status:  Patient is independent with mobility/ambulation, transfers, ADL's, IADL's.   Zubrod Score: At the time of surgery this patient's most appropriate activity status/level should be described as: '[]'$     0    Normal activity, no symptoms '[]'$     1    Restricted in physical strenuous activity but ambulatory, able to do out light work '[x]'$     2    Ambulatory and capable of self care, unable to do work activities, up and about               >50 % of waking hours                              '[]'$     3    Only limited self care, in bed greater than 50% of waking hours '[]'$     4    Completely disabled, no self care, confined to bed or chair '[]'$     5    Moribund   Past Medical History:  Diagnosis Date  . Anxiety   . Depression   . Hypertension    due to psych med  . Incontinence     Past Surgical History:  Procedure Laterality Date  .  aneurysm clip    . PROSTATECTOMY    . REPLACEMENT TOTAL KNEE      Family History  Problem Relation Age of Onset  . Depression Sister     Social History   Social History  . Marital status: Married    Spouse name: N/A  . Number of children: N/A  . Years of education: N/A   Occupational History  . Retired previously worked on gradual    Social History Main Topics  . Smoking status: Never Smoker  . Smokeless tobacco: Not on file  . Alcohol use No  . Drug use: No  . Sexual activity: Not Currently   Other Topics Concern  . Not on file   Social History Narrative  . No narrative on file    History  Smoking Status  . Never Smoker  Smokeless Tobacco  . Never Used    History  Alcohol Use No     No Known Allergies  Current Outpatient Prescriptions  Medication Sig Dispense Refill  . amiodarone (PACERONE) 200 MG tablet Take  200 mg by mouth daily.    Marland Kitchen apixaban (ELIQUIS) 5 MG TABS tablet Take 5 mg by mouth 2 (two) times daily.    . ARIPiprazole (ABILIFY) 2 MG tablet Take 1 mg by mouth daily.     Marland Kitchen aspirin EC 81 MG tablet Take 81 mg by mouth daily.    Marland Kitchen atenolol (TENORMIN) 25 MG tablet Take by mouth daily.    . benztropine (COGENTIN) 1 MG tablet Take 1  mg by mouth at bedtime. 1/2 tablet at night    . buPROPion (WELLBUTRIN XL) 150 MG 24 hr tablet Take 300 mg by mouth daily.     . clorazepate (TRANXENE) 7.5 MG tablet Take 3.75 mg by mouth 2 (two) times daily as needed for anxiety.     Marland Kitchen omeprazole (PRILOSEC) 20 MG capsule Take 20 mg by mouth daily.    . Vilazodone HCl (VIIBRYD) 40 MG TABS Take by mouth daily.     No current facility-administered medications for this visit.       Review of Systems:     Cardiac Review of Systems: Y or N  Chest Pain Florencio.Farrier    ]  Resting SOB [  n ] Exertional SOB  Blue.Reese  ]  Orthopnea Blue.Reese  ]   Pedal Edema Florencio.Farrier   ]    Palpitations Florencio.Farrier  ] Syncope  [ n ]   Presyncope [ n  ]  General Review of Systems: [Y] = yes [  ]=no Constitional: recent weight change [ n ];  Wt loss over the last 3 months [   ] anorexia [  ]; fatigue [  ]; nausea [  ]; night sweats [  ]; fever [  ]; or chills [  ];          Dental: poor dentition[  ]; Last Dentist visit:   Eye : blurred vision [  ]; diplopia [   ]; vision changes [  ];  Amaurosis fugax[  ]; Resp: cough [  ];  wheezing[  ];  hemoptysis[  ]; shortness of breath[  ]; paroxysmal nocturnal dyspnea[  ]; dyspnea on exertion[  ]; or orthopnea[  ];  GI:  gallstones[  ], vomiting[  ];  dysphagia[  ]; melena[  ];  hematochezia [  ]; heartburn[  ];   Hx of  Colonoscopy[  ]; GU: kidney stones [  ]; hematuria[  ];   dysuria [  ];  nocturia[  ];  history of     obstruction [  ]; urinary frequency [  ]             Skin: rash, swelling[  ];, hair loss[  ];  peripheral edema[  ];  or itching[  ]; Musculosketetal: myalgias[  ];  joint swelling[  ];  joint erythema[  ];  joint  pain[  ];  back pain[  ];  Heme/Lymph: bruising[y  ];  bleeding[  ];  anemia[  ];  Neuro: TIA[  ];  headaches[  ];  stroke[  ];  vertigo[  ];  seizures[  ];   paresthesias[  ];  difficulty walking[  ];  Psych:depression[ y ]; anxiety[  ];  Endocrine: diabetes[  ];  thyroid dysfunction[  ];  Immunizations: Flu up to date [  ?]; Pneumococcal up to date [ > ];  Other:  Physical Exam: BP (!) 134/92   Pulse 99   Resp 16   Ht '6\' 2"'$  (1.88 m)   Wt 290 lb (131.5 kg)   SpO2 97% Comment: ON RA  BMI 37.23 kg/m   PHYSICAL EXAMINATION: General appearance: slowed mentation Head: Normocephalic, without obvious abnormality, atraumatic Neck: no adenopathy, no carotid bruit, no JVD, supple, symmetrical, trachea midline and thyroid not enlarged, symmetric, no tenderness/mass/nodules Lymph nodes: Cervical, supraclavicular, and axillary nodes normal. and left goin node biopsyed via needle dbx Resp: diminished breath sounds bibasilar Back: symmetric, no curvature. ROM normal. No CVA tenderness. Cardio: regular rate  and rhythm, S1, S2 normal, no murmur, click, rub or gallop GI: soft, non-tender; bowel sounds normal; no masses,  no organomegaly Extremities: extremities normal, atraumatic, no cyanosis or edema and Homans sign is negative, no sign of DVT Neurologic: Mental status: alertness: lethargic, affect: flat  Diagnostic Studies & Laboratory data:     Recent Radiology Findings:   CLINICAL DATA:Initial treatment strategy for mediastinal lymphadenopathy.  EXAM: NUCLEAR MEDICINE PET SKULL BASE TO THIGH  TECHNIQUE: 5 MCi F-18 FDG was injected intravenously. Full-ring PET imaging was performed from the skull base to thigh after the radiotracer. CT data was obtained and used for attenuation correction and anatomic localization.  FASTING BLOOD GLUCOSE:Value: 114 mg/dl  COMPARISON:CT chest 09/01/2015  FINDINGS: NECK  There is bilateral hypermetabolic activity with the thyroid  gland. There is nodular enlargement of the RIGHT lobe of thyroid gland to 2.8 by 3.4 cm. This nodule enlargement extends from the lower pole the RIGHT gland into the RIGHT upper paratracheal mediastinum (image 96 series 3). Diffuse metabolic activity favors thyroiditis with goiter.  CHEST  Hypermetabolic soft tissue thickening in the LEFT suprahilar lung / mediastinum measures 2.6 cm (image 117, series 3) with intense metabolic activity (insert SUV max 8.9).  There is a hypermetabolic LEFT lower paratracheal lymph node measures 13 mm with SUV max equal 5.9. No contralateral hypermetabolic lymph nodes. No supraclavicular lymph nodes.  Bilateral mild metabolic activity associated with axial lymph nodes. These lymph nodes have normal morphology. For example peripheral lymph node in the RIGHT axilla with SUV max equal 1.9.  ABDOMEN/PELVIS  No abnormal hypermetabolic activity within the liver, pancreas, adrenal glands, or spleen.  Hypermetabolic lymph node in the medial LEFT inguinal region with measuring 21 mm short axis node on image 338, series 3 with SUV max equal 7.0.  Within the central mesentery, prominent lymph nodes surrounded by slight haziness to the mesentery (image 221, series 3). Mild metabolic activity associated with these prominent lymph nodes (SUV max of 3.2).  Spleen is normal.  SKELETON  No focal hypermetabolic activity to suggest skeletal metastasis.  IMPRESSION: 1. Hypermetabolic LEFT suprahilar mass consists primary bronchogenic carcinoma. 2. Ipsilateral nodal metastasis to the LEFT lower paratracheal nodal station. 3. No evidence of distant metastatic consistent bronchogenic carcinoma. 4. Bilateral mildly hypermetabolic axillary lymph nodes. Prominent central mesenteric adenopathy with hazy mesentery pattern. Intensely hypermetabolic LEFT inguinal lymph node. Overall the findings raise the concern for low-grade lymphoproliferative process.  Recommend biopsy of the most hypermetabolic lesion (medial LEFT inguinal lymph node).   Electronically Signed ByOswald Hillock M.D. On: 09/29/2015 14:05 I have independently reviewed the above radiologic studies.  Recent Lab Findings: Lab Results  Component Value Date   WBC 7.2 01/15/2014   HGB 14.7 01/15/2014   HCT 42.9 01/15/2014   PLT 210 01/15/2014   GLUCOSE 123 (H) 01/15/2014   ALT 27 01/15/2014   AST 31 01/15/2014   NA 139 01/15/2014   K 4.8 01/15/2014   CL 103 01/15/2014   CREATININE 1.12 01/15/2014   BUN 17 01/15/2014   CO2 26 01/15/2014      Assessment / Plan:   Saddle pulmonary embolus March 2017  Relatively new onset of atrial fibrillation, appears intermittent Lifelong nonsmoker New diagnosis of follicular cell low-grade lymphoma, needle biopsy of left groin Left hilar mass suggestive of non-small cell lung cancer with ipsilateral nodal metastasis by PET scan- workup so far reveals negative bronchoscopy  Discussed with patient proceeding with bronchoscopy ebus with transbronchial biopsy after clearance by cardiology and  primary care in regard to his anesthesia risk and the need to stop anticoagulation for a brief period of time around the time of airway biopsy. This showed as we hear from primary care and cardiology will proceed with bronchoscopy and ebus and biopsy.    I  spent 40 minutes counseling the patient face to face and 50% or more the  time was spent in counseling and coordination of care. The total time spent in the appointment was 60 minutes.  Grace Isaac MD      [address] [place] [address] Office [phone]   Beeper [phone]  11/09/2015 4:09 PM

## 2015-11-11 ENCOUNTER — Telehealth: Payer: Self-pay | Admitting: *Deleted

## 2015-11-11 ENCOUNTER — Other Ambulatory Visit: Payer: Self-pay | Admitting: *Deleted

## 2015-11-11 DIAGNOSIS — I481 Persistent atrial fibrillation: Secondary | ICD-10-CM | POA: Diagnosis not present

## 2015-11-11 DIAGNOSIS — Z01810 Encounter for preprocedural cardiovascular examination: Secondary | ICD-10-CM | POA: Diagnosis not present

## 2015-11-11 DIAGNOSIS — R42 Dizziness and giddiness: Secondary | ICD-10-CM | POA: Diagnosis not present

## 2015-11-11 DIAGNOSIS — Z86711 Personal history of pulmonary embolism: Secondary | ICD-10-CM | POA: Diagnosis not present

## 2015-11-11 DIAGNOSIS — R918 Other nonspecific abnormal finding of lung field: Secondary | ICD-10-CM

## 2015-11-11 NOTE — Telephone Encounter (Signed)
Patient has been cleared by Dr. Jimmie Molly for open lung biopsy under general anesthesia.  They have stopped his ASA and said he can stop his Eliquis 3 days pre-op.  He will resume it ASAP post-op per Dr. Everrett Coombe instructions.    Dr. Servando Snare has been made aware of this so the patient is scheduled for Bronch, EBUS, biopsy on Tuesday 8/15 @ 7:30am.  Patient and wife are aware and have no further questions.

## 2015-11-15 ENCOUNTER — Encounter (HOSPITAL_COMMUNITY)
Admission: RE | Admit: 2015-11-15 | Discharge: 2015-11-15 | Disposition: A | Discharge status: Home / Self Care | Payer: PPO | Source: Ambulatory Visit | Attending: Cardiothoracic Surgery | Admitting: Cardiothoracic Surgery

## 2015-11-15 ENCOUNTER — Ambulatory Visit (HOSPITAL_COMMUNITY)
Admission: RE | Admit: 2015-11-15 | Discharge: 2015-11-15 | Disposition: A | Discharge status: Home / Self Care | Payer: PPO | Source: Ambulatory Visit | Attending: Cardiothoracic Surgery | Admitting: Cardiothoracic Surgery

## 2015-11-15 ENCOUNTER — Encounter (HOSPITAL_COMMUNITY): Payer: Self-pay

## 2015-11-15 DIAGNOSIS — Z01812 Encounter for preprocedural laboratory examination: Secondary | ICD-10-CM | POA: Diagnosis not present

## 2015-11-15 DIAGNOSIS — Z7982 Long term (current) use of aspirin: Secondary | ICD-10-CM | POA: Diagnosis not present

## 2015-11-15 DIAGNOSIS — Z79899 Other long term (current) drug therapy: Secondary | ICD-10-CM | POA: Diagnosis not present

## 2015-11-15 DIAGNOSIS — Z01818 Encounter for other preprocedural examination: Secondary | ICD-10-CM | POA: Insufficient documentation

## 2015-11-15 DIAGNOSIS — I481 Persistent atrial fibrillation: Secondary | ICD-10-CM | POA: Insufficient documentation

## 2015-11-15 DIAGNOSIS — Z7901 Long term (current) use of anticoagulants: Secondary | ICD-10-CM | POA: Diagnosis not present

## 2015-11-15 DIAGNOSIS — R918 Other nonspecific abnormal finding of lung field: Secondary | ICD-10-CM | POA: Diagnosis not present

## 2015-11-15 DIAGNOSIS — I119 Hypertensive heart disease without heart failure: Secondary | ICD-10-CM | POA: Diagnosis not present

## 2015-11-15 HISTORY — DX: Reserved for inherently not codable concepts without codable children: IMO0001

## 2015-11-15 HISTORY — DX: Other persistent atrial fibrillation: I48.19

## 2015-11-15 HISTORY — DX: Cardiac arrhythmia, unspecified: I49.9

## 2015-11-15 LAB — PROTIME-INR
INR: 1.1
Prothrombin Time: 14.2 seconds (ref 11.4–15.2)

## 2015-11-15 LAB — COMPREHENSIVE METABOLIC PANEL
ALT: 50 U/L (ref 17–63)
AST: 29 U/L (ref 15–41)
Albumin: 4 g/dL (ref 3.5–5.0)
Alkaline Phosphatase: 45 U/L (ref 38–126)
Anion gap: 7 (ref 5–15)
BUN: 15 mg/dL (ref 6–20)
CO2: 23 mmol/L (ref 22–32)
Calcium: 9.5 mg/dL (ref 8.9–10.3)
Chloride: 108 mmol/L (ref 101–111)
Creatinine, Ser: 1.19 mg/dL (ref 0.61–1.24)
GFR calc Af Amer: >60 mL/min (ref >60)
GFR calc non Af Amer: >60 mL/min (ref >60)
Glucose, Bld: 137 mg/dL — ABNORMAL HIGH (ref 65–99)
Potassium: 4.3 mmol/L (ref 3.5–5.1)
Sodium: 138 mmol/L (ref 135–145)
Total Bilirubin: 0.9 mg/dL (ref 0.3–1.2)
Total Protein: 7.1 g/dL (ref 6.5–8.1)

## 2015-11-15 LAB — CBC
HCT: 49.6 % (ref 39.0–52.0)
Hemoglobin: 16.6 g/dL (ref 13.0–17.0)
MCH: 32 pg (ref 26.0–34.0)
MCHC: 33.5 g/dL (ref 30.0–36.0)
MCV: 95.8 fL (ref 78.0–100.0)
Platelets: 181 10*3/uL (ref 150–400)
RBC: 5.18 MIL/uL (ref 4.22–5.81)
RDW: 14.5 % (ref 11.5–15.5)
WBC: 10.2 10*3/uL (ref 4.0–10.5)

## 2015-11-15 LAB — APTT: aPTT: 31 seconds (ref 24–36)

## 2015-11-15 NOTE — Progress Notes (Addendum)
Anesthesia Chart Review:  Pt is a 65 year old male scheduled for video bronchoscopy with endobronchial ultrasound, lung biopsy on 11/16/2015 with Lanelle Bal, MD.   Cardiologist is Danie Binder, MD who has cleared pt for surgery (notes in care everywhere)  PMH includes:  Persistent atrial fibrillation, HTN. Never smoker. BMI 37  Medications include: amiodarone, eliquis, abilify, ASA, atenolol, benztropine, lasix, prilosec. Pt stopped eliquis 3 days before surgery.   EKG 11/15/15: Atrial fibrillation with RVR. Indeterminate axis  HR at PAT was 83 bpm.  Echo 06/17/15 (care everywhere):  - The left ventricular size is normal with normal left ventricular wall thickness. - Left ventricular systolic function is normal; LVEF 55-60%. - Left ventricular filling pattern is impaired. - No segmental wall motion abnormalities seen in the left ventricle. - The right ventricle is moderate to severely dilated with at least moderately reduced systolic function. Subtle sparing of RV apical wall motion consistent with McConnell's sign in setting of PE. - Elevation of PA pressures with a PASP of 55 mmHg. - Injection of agitated saline showed no significant right-to-left shunt. - There is no significant valvular stenosis or regurgitation   If no changes, I anticipate pt can proceed with surgery as scheduled.   Willeen Cass, FNP-BC [facility]/Anesthesiology Phone: [phone] 11/15/2015 3:43 PM

## 2015-11-15 NOTE — Pre-Procedure Instructions (Signed)
    Edward Johnston  11/15/2015      [id number] [place], [place] - [address] [address]Phone: [phone] Fax: [phone]   Your procedure is scheduled on 11/16/15.  [facility]  Call this number if you have problems the morning of surgery:  [phone]   Remember:  Do not eat food or drink liquids after midnight.  Take these medicines the morning of surgery with A SIP OF WATER --amiodarone,atenolol,wellbutrin,prilosec,tranxene   Do not wear jewelry, make-up or nail polish.  Do not wear lotions, powders, or perfumes.  You may wear deoderant.  Do not shave 48 hours prior to surgery.  Men may shave face and neck.  Do not bring valuables to the hospital.  [facility] belongings or [facility]ables.  Contacts, dentures or bridgework may not be worn into surgery.  Leave your suitcase in the car.  After surgery it may be brought to your room.  For patients admitted to the hospital, discharge time will be determined by your treatment team.  Patients discharged the day of surgery will not be allowed to drive home.  Name and phone number of your driver:    Special instructions:  Do not take any aspirin,anti-inflammatories,vitamins,or herbal supplements 5-7 days prior to surgery.  Please read over the following fact sheets that you were given.

## 2015-11-15 NOTE — Anesthesia Preprocedure Evaluation (Addendum)
Anesthesia Evaluation  Patient identified by MRN, date of birth, ID band Patient awake    History of Anesthesia Complications Negative for: history of anesthetic complications  Airway Mallampati: II  TM Distance: <3 FB Neck ROM: Full    Dental  (+) Dental Advisory Given, Poor Dentition,    Pulmonary    breath sounds clear to auscultation       Cardiovascular hypertension, + dysrhythmias  Rhythm:Irregular Rate:Normal     Neuro/Psych PSYCHIATRIC DISORDERS negative neurological ROS     GI/Hepatic negative GI ROS, Neg liver ROS,   Endo/Other  negative endocrine ROS  Renal/GU negative Renal ROS     Musculoskeletal   Abdominal (+) + obese,   Peds  Hematology  (+) Blood dyscrasia, ,   Anesthesia Other Findings   Reproductive/Obstetrics                            Anesthesia Physical Anesthesia Plan  ASA: III  Anesthesia Plan: General   Post-op Pain Management:    Induction: Intravenous  Airway Management Planned: Oral ETT  Additional Equipment:   Intra-op Plan:   Post-operative Plan: Extubation in OR  Informed Consent: I have reviewed the patients History and Physical, chart, labs and discussed the procedure including the risks, benefits and alternatives for the proposed anesthesia with the patient or authorized representative who has indicated his/her understanding and acceptance.     Plan Discussed with: CRNA  Anesthesia Plan Comments:         Anesthesia Quick Evaluation

## 2015-11-16 ENCOUNTER — Ambulatory Visit (HOSPITAL_COMMUNITY): Payer: PPO | Admitting: Emergency Medicine

## 2015-11-16 ENCOUNTER — Ambulatory Visit (HOSPITAL_COMMUNITY): Payer: PPO

## 2015-11-16 ENCOUNTER — Ambulatory Visit (HOSPITAL_COMMUNITY)
Admission: RE | Admit: 2015-11-16 | Discharge: 2015-11-16 | Disposition: A | Discharge status: Home / Self Care | Payer: PPO | Source: Ambulatory Visit | Attending: Cardiothoracic Surgery | Admitting: Cardiothoracic Surgery

## 2015-11-16 ENCOUNTER — Encounter (HOSPITAL_COMMUNITY)
Admission: RE | Disposition: A | Discharge status: Home / Self Care | Payer: Self-pay | Source: Ambulatory Visit | Attending: Cardiothoracic Surgery

## 2015-11-16 ENCOUNTER — Encounter (HOSPITAL_COMMUNITY): Payer: Self-pay

## 2015-11-16 ENCOUNTER — Ambulatory Visit (HOSPITAL_COMMUNITY): Payer: PPO | Admitting: Anesthesiology

## 2015-11-16 DIAGNOSIS — I10 Essential (primary) hypertension: Secondary | ICD-10-CM | POA: Diagnosis not present

## 2015-11-16 DIAGNOSIS — J9811 Atelectasis: Secondary | ICD-10-CM | POA: Diagnosis not present

## 2015-11-16 DIAGNOSIS — I482 Chronic atrial fibrillation: Secondary | ICD-10-CM | POA: Insufficient documentation

## 2015-11-16 DIAGNOSIS — E669 Obesity, unspecified: Secondary | ICD-10-CM | POA: Insufficient documentation

## 2015-11-16 DIAGNOSIS — Z6837 Body mass index (BMI) 37.0-37.9, adult: Secondary | ICD-10-CM | POA: Diagnosis not present

## 2015-11-16 DIAGNOSIS — Z79899 Other long term (current) drug therapy: Secondary | ICD-10-CM | POA: Diagnosis not present

## 2015-11-16 DIAGNOSIS — R590 Localized enlarged lymph nodes: Secondary | ICD-10-CM | POA: Insufficient documentation

## 2015-11-16 DIAGNOSIS — C8285 Other types of follicular lymphoma, lymph nodes of inguinal region and lower limb: Secondary | ICD-10-CM | POA: Diagnosis not present

## 2015-11-16 DIAGNOSIS — Z7901 Long term (current) use of anticoagulants: Secondary | ICD-10-CM | POA: Insufficient documentation

## 2015-11-16 DIAGNOSIS — Z7982 Long term (current) use of aspirin: Secondary | ICD-10-CM | POA: Insufficient documentation

## 2015-11-16 DIAGNOSIS — F329 Major depressive disorder, single episode, unspecified: Secondary | ICD-10-CM | POA: Diagnosis not present

## 2015-11-16 DIAGNOSIS — R220 Localized swelling, mass and lump, head: Secondary | ICD-10-CM | POA: Diagnosis not present

## 2015-11-16 DIAGNOSIS — R918 Other nonspecific abnormal finding of lung field: Secondary | ICD-10-CM | POA: Diagnosis not present

## 2015-11-16 DIAGNOSIS — Z86711 Personal history of pulmonary embolism: Secondary | ICD-10-CM | POA: Diagnosis not present

## 2015-11-16 DIAGNOSIS — Z96659 Presence of unspecified artificial knee joint: Secondary | ICD-10-CM | POA: Diagnosis not present

## 2015-11-16 DIAGNOSIS — Z09 Encounter for follow-up examination after completed treatment for conditions other than malignant neoplasm: Secondary | ICD-10-CM

## 2015-11-16 DIAGNOSIS — I481 Persistent atrial fibrillation: Secondary | ICD-10-CM | POA: Diagnosis not present

## 2015-11-16 HISTORY — PX: LYMPH NODE BIOPSY: SHX201

## 2015-11-16 HISTORY — DX: Other complications of anesthesia, initial encounter: T88.59XA

## 2015-11-16 HISTORY — DX: Adverse effect of unspecified anesthetic, initial encounter: T41.45XA

## 2015-11-16 HISTORY — DX: Other specified postprocedural states: Z98.890

## 2015-11-16 HISTORY — DX: Other specified postprocedural states: R11.2

## 2015-11-16 HISTORY — PX: VIDEO BRONCHOSCOPY WITH ENDOBRONCHIAL ULTRASOUND: SHX6177

## 2015-11-16 SURGERY — BRONCHOSCOPY, WITH EBUS
Anesthesia: General

## 2015-11-16 MED ORDER — PROPOFOL 10 MG/ML IV BOLUS
INTRAVENOUS | Status: DC | PRN
  Administered 2015-11-16: 175 mg via INTRAVENOUS

## 2015-11-16 MED ORDER — ONDANSETRON HCL 4 MG/2ML IJ SOLN
INTRAMUSCULAR | Status: DC | PRN
  Administered 2015-11-16: 4 mg via INTRAVENOUS

## 2015-11-16 MED ORDER — LACTATED RINGERS IV SOLN
INTRAVENOUS | Status: DC | PRN
  Administered 2015-11-16: 07:00:00 via INTRAVENOUS

## 2015-11-16 MED ORDER — PHENYLEPHRINE HCL 10 MG/ML IJ SOLN
INTRAVENOUS | Status: DC | PRN
  Administered 2015-11-16: 15 ug/min via INTRAVENOUS

## 2015-11-16 MED ORDER — LIDOCAINE HCL (CARDIAC) 20 MG/ML IV SOLN
INTRAVENOUS | Status: DC | PRN
  Administered 2015-11-16: 40 mg via INTRAVENOUS

## 2015-11-16 MED ORDER — HYDROMORPHONE HCL 1 MG/ML IJ SOLN: 0.2500 mg | INTRAMUSCULAR | Status: DC | PRN

## 2015-11-16 MED ORDER — SUGAMMADEX SODIUM 500 MG/5ML IV SOLN
INTRAVENOUS | Status: DC | PRN
  Administered 2015-11-16: 261 mg via INTRAVENOUS

## 2015-11-16 MED ORDER — PROPOFOL 10 MG/ML IV BOLUS
INTRAVENOUS | Status: AC
  Filled 2015-11-16: qty 20

## 2015-11-16 MED ORDER — MIDAZOLAM HCL 2 MG/2ML IJ SOLN
INTRAMUSCULAR | Status: AC
  Filled 2015-11-16: qty 2

## 2015-11-16 MED ORDER — PROMETHAZINE HCL 25 MG/ML IJ SOLN: 6.2500 mg | INTRAMUSCULAR | Status: DC | PRN

## 2015-11-16 MED ORDER — ROCURONIUM BROMIDE 100 MG/10ML IV SOLN
INTRAVENOUS | Status: DC | PRN
  Administered 2015-11-16: 70 mg via INTRAVENOUS
  Administered 2015-11-16: 10 mg via INTRAVENOUS

## 2015-11-16 MED ORDER — PHENYLEPHRINE HCL 10 MG/ML IJ SOLN
INTRAMUSCULAR | Status: DC | PRN
  Administered 2015-11-16: 40 ug via INTRAVENOUS
  Administered 2015-11-16 (×2): 80 ug via INTRAVENOUS

## 2015-11-16 MED ORDER — SUCCINYLCHOLINE CHLORIDE 20 MG/ML IJ SOLN
INTRAMUSCULAR | Status: DC | PRN
  Administered 2015-11-16: 140 mg via INTRAVENOUS

## 2015-11-16 MED ORDER — FENTANYL CITRATE (PF) 250 MCG/5ML IJ SOLN
INTRAMUSCULAR | Status: AC
  Filled 2015-11-16: qty 5

## 2015-11-16 MED ORDER — EPINEPHRINE HCL 1 MG/ML IJ SOLN
INTRAMUSCULAR | Status: AC
  Filled 2015-11-16: qty 1

## 2015-11-16 MED ORDER — EPINEPHRINE HCL 1 MG/ML IJ SOLN
INTRAMUSCULAR | Status: DC | PRN
Start: 2015-11-16 — End: 2015-11-16
  Administered 2015-11-16: 1 mg via ENDOTRACHEOPULMONARY

## 2015-11-16 SURGICAL SUPPLY — 24 items
BRUSH CYTOL CELLEBRITY 1.5X140 (MISCELLANEOUS) IMPLANT
CANISTER SUCTION 2500CC (MISCELLANEOUS) ×2 IMPLANT
CONT SPEC 4OZ CLIKSEAL STRL BL (MISCELLANEOUS) ×2 IMPLANT
COVER DOME SNAP 22 D (MISCELLANEOUS) ×2 IMPLANT
COVER TABLE BACK 60X90 (DRAPES) ×2 IMPLANT
FORCEPS BIOP RJ4 1.8 (CUTTING FORCEPS) IMPLANT
GAUZE SPONGE 4X4 12PLY STRL (GAUZE/BANDAGES/DRESSINGS) ×2 IMPLANT
GLOVE BIO SURGEON STRL SZ 6.5 (GLOVE) ×2 IMPLANT
GLOVE BIOGEL M 6.5 STRL (GLOVE) ×2 IMPLANT
KIT CLEAN ENDO COMPLIANCE (KITS) ×6 IMPLANT
KIT ROOM TURNOVER OR (KITS) ×2 IMPLANT
MARKER SKIN DUAL TIP RULER LAB (MISCELLANEOUS) ×2 IMPLANT
NEEDLE BIOPSY TRANSBRONCH 21G (NEEDLE) IMPLANT
NEEDLE BLUNT 18X1 FOR OR ONLY (NEEDLE) IMPLANT
NEEDLE EBUS SONO TIP PENTAX (NEEDLE) ×2 IMPLANT
NS IRRIG 1000ML POUR BTL (IV SOLUTION) ×2 IMPLANT
OIL SILICONE PENTAX (PARTS (SERVICE/REPAIRS)) ×2 IMPLANT
PAD ARMBOARD 7.5X6 YLW CONV (MISCELLANEOUS) ×4 IMPLANT
SYR 20CC LL (SYRINGE) ×2 IMPLANT
SYR 20ML ECCENTRIC (SYRINGE) ×2 IMPLANT
TOWEL OR 17X24 6PK STRL BLUE (TOWEL DISPOSABLE) ×2 IMPLANT
TRAP SPECIMEN MUCOUS 40CC (MISCELLANEOUS) ×2 IMPLANT
TUBE CONNECTING 20X1/4 (TUBING) ×2 IMPLANT
WATER STERILE IRR 1000ML POUR (IV SOLUTION) ×2 IMPLANT

## 2015-11-16 NOTE — Anesthesia Postprocedure Evaluation (Signed)
Anesthesia Post Note  Patient: Edward Johnston  Procedure(s) Performed: Procedure(s) (LRB): VIDEO BRONCHOSCOPY WITH ENDOBRONCHIAL ULTRASOUND (N/A) Biopsy of node 7,4L and 10L (N/A)  Patient location during evaluation: PACU Anesthesia Type: General Level of consciousness: awake and alert Pain management: pain level controlled Vital Signs Assessment: post-procedure vital signs reviewed and stable Respiratory status: spontaneous breathing, nonlabored ventilation, respiratory function stable and patient connected to nasal cannula oxygen Cardiovascular status: blood pressure returned to baseline and stable Postop Assessment: no signs of nausea or vomiting Anesthetic complications: no    Last Vitals:  Vitals:   11/16/15 1024 11/16/15 1032  BP:  111/74  Pulse: 99 (!) 101  Resp: 19 20  Temp: 36.9 C     Last Pain:  Vitals:   11/16/15 0621  TempSrc: Oral                 [name],JAMES TERRILL

## 2015-11-16 NOTE — H&P (Signed)
[address]       [place],Edward Johnston             [id number]                    Edward Johnston Date of Birth: June 17, 1950  Referring: Dr Glade Lloyd [place]  Primary Care: [facility] Edward Blonder., MD  Chief Complaint:    Lung mass   History of Present Illness:    Edward Johnston 65 y.o. male is seen in the office for consideration for bronchoscopy and ebus with transbronchial biopsy of mediastinal nodes. Patient was recently diagnosed with a low-grade lymphoma from a right groin biopsy. He presented to [facility] in March 2017 with saddle pulmonary embolus confirmed on CT scan . A second CT scan of the chest was done in May 2017 when he presented for routine visit to primary care was noted to be tachycardic, on this follow-up scan is noted to have a left hilar mass since we underwent bronchoscopy done by Dr Glade Lloyd in [facility]. No endobronchial lesions were noted, the patient's Eliquist  was resumed.. On 628 he had a PET scan which showed left hypermetabolic suprahilar mass suggestive of primary carcinoma the lung and ipsilateral nodal metastasis to the left lower paratracheal nodes is also noted to have bilateral mildly hypermanic Aulik axillary nodes on July 3 needle biopsy was performed a right inguinal node revealing follicular lymphoma low-grade.    The patient's overall activity level is extremely low spending more than 75% of his time sitting in the chair at home. He notes he gets short of breath with minimal activity such as just walking around in the yard. , He's been seen by cardiology when he had presented several months ago with rapid atrial fibrillation. He has no history of known cardiac disease previously  Patient is a lifelong nonsmoker    Edward Ghazi, MD - 10/28/2015 8:22 AM EDT Procedure: Bronchoscopy with endobronchial biopsy of the left upper lobe along with washing of the left upper lobe Indication:  65 year old male who is being evaluated for a hilar mass density that is positive on PET scan patient was recently diagnosed with lymphoma from a inguinal biopsy Performed by ; Edward Ghazi MD Anesthetic: Versed 4 mg IV, morphine 8 mg IV and instillation of 2% and 1% lidocaine via bronchoscope Findings: Bronchoscope was passed on patient right nasal orifice down to the nasal passage. Vocal cords were visualized and appears to be normal. Vocal cords was anesthetized with 2% lidocaine. After the vocal cords were properly anesthetized bronchoscope passed beyond the vocal cords down to patient trachea. Rest of the airway was anesthetized with 1% lidocaine. As noted trachea appears to be normal. Carina appears to be normal. Subsequently bronchoscope was passed down the right tracheobronchial tree the right mainstem appears to be normal. Right upper lobe apical anterior and posterior segment were visualized and appears to be normal. Rhonchus intermedius was normal. Right middle lobe and its segments were visualized and appears be normal. Right lower lobe and all its basal segments and including superior segment of the right upper lobe on appears be normal there was no loss mass lesion seen. Subsequently the bronchoscope was passed down the left tracheobronchial tree. Left mainstem appears to be normal. Left upper lobe was visualized and appears to be normal except there was a regular rate noted in that the orifice of the left upper lobe. The lingula  lobe was normal. The left lower lobe and is being sent on appears be normal. Subsequently washes was performed of the left tracheobronchial tree particularly the left upper lobe. The area of radiation that was noted (see illustration) was biopsy. Specimens was collected at collected bronchoscope was removed from patient's airway. Specimen: All being sent for cytology and passed Impression: Left hilar density that was positive on PET scan with essentially normal  bronchoscopy examination.        Current Activity/ Functional Status:  Patient is independent with mobility/ambulation, transfers, ADL's, IADL's.   Edward Johnston Score: At the time of surgery this patient's most appropriate activity status/level should be described as: '[]'$     0    Normal activity, no symptoms '[]'$     1    Restricted in physical strenuous activity but ambulatory, able to do out light work '[x]'$     2    Ambulatory and capable of self care, unable to do work activities, up and about               >50 % of waking hours                              '[]'$     3    Only limited self care, in bed greater than 50% of waking hours '[]'$     4    Completely disabled, no self care, confined to bed or chair '[]'$     5    Moribund   Past Medical History:  Diagnosis Date  . Anxiety   . Complication of anesthesia   . Depression   . Dysrhythmia   . Hypertension    due to psych med  . Incontinence   . Persistent atrial fibrillation ([place])   . PONV (postoperative nausea and vomiting)   . Shortness of breath dyspnea     Past Surgical History:  Procedure Laterality Date  .  aneurysm clip    . BRAIN SURGERY  1989   aneurysm clipping- [facility]  . JOINT REPLACEMENT    . PROSTATECTOMY    . REPLACEMENT TOTAL KNEE      Family History  Problem Relation Age of Onset  . Depression Sister     Social History   Social History  . Marital status: Married    Spouse name: N/A  . Number of children: N/A  . Years of education: N/A   Occupational History  . Retired previously worked on gradual    Social History Main Topics  . Smoking status: Never Smoker  . Smokeless tobacco: Not on file  . Alcohol use No  . Drug use: No  . Sexual activity: Not Currently   Other Topics Concern  . Not on file   Social History Narrative  . No narrative on file    History  Smoking Status  . Never Smoker  Smokeless Tobacco  . Never Used    History  Alcohol Use No     Allergies  Allergen Reactions  .  No Known Allergies     No current facility-administered medications for this encounter.       Review of Systems:     Cardiac Review of Systems: Y or N  Chest Pain Edward Johnston    ]  Resting SOB [  n ] Exertional SOB  Blue.Reese  ]  Orthopnea Blue.Reese  ]   Pedal Edema Edward Johnston   ]    Palpitations Edward Johnston  ]  Syncope  [ n ]   Presyncope [ n  ]  General Review of Systems: [Y] = yes [  ]=no Constitional: recent weight change [ n ];  Wt loss over the last 3 months [   ] anorexia [  ]; fatigue [  ]; nausea [  ]; night sweats [  ]; fever [  ]; or chills [  ];          Dental: poor dentition[  ]; Last Dentist visit:   Eye : blurred vision [  ]; diplopia [   ]; vision changes [  ];  Amaurosis fugax[  ]; Resp: cough [  ];  wheezing[  ];  hemoptysis[  ]; shortness of breath[  ]; paroxysmal nocturnal dyspnea[  ]; dyspnea on exertion[  ]; or orthopnea[  ];  GI:  gallstones[  ], vomiting[  ];  dysphagia[  ]; melena[  ];  hematochezia [  ]; heartburn[  ];   Hx of  Colonoscopy[  ]; GU: kidney stones [  ]; hematuria[  ];   dysuria [  ];  nocturia[  ];  history of     obstruction [  ]; urinary frequency [  ]             Skin: rash, swelling[  ];, hair loss[  ];  peripheral edema[  ];  or itching[  ]; Musculosketetal: myalgias[  ];  joint swelling[  ];  joint erythema[  ];  joint pain[  ];  back pain[  ];  Heme/Lymph: bruising[y  ];  bleeding[  ];  anemia[  ];  Neuro: TIA[  ];  headaches[  ];  stroke[  ];  vertigo[  ];  seizures[  ];   paresthesias[  ];  difficulty walking[  ];  Psych:depression[ y ]; anxiety[  ];  Endocrine: diabetes[  ];  thyroid dysfunction[  ];  Immunizations: Flu up to date [  ?]; Pneumococcal up to date [ > ];  Other:  Physical Exam: BP 101/75   Pulse 76   Temp 98.6 F (37 C) (Oral)   Resp 20   SpO2 98%   PHYSICAL EXAMINATION: General appearance: slowed mentation Head: Normocephalic, without obvious abnormality, atraumatic Neck: no adenopathy, no carotid bruit, no JVD, supple, symmetrical, trachea midline  and thyroid not enlarged, symmetric, no tenderness/mass/nodules Lymph nodes: Cervical, supraclavicular, and axillary nodes normal. and left goin node biopsyed via needle dbx Resp: diminished breath sounds bibasilar Back: symmetric, no curvature. ROM normal. No CVA tenderness. Cardio: regular rate and rhythm, S1, S2 normal, no murmur, click, rub or gallop GI: soft, non-tender; bowel sounds normal; no masses,  no organomegaly Extremities: extremities normal, atraumatic, no cyanosis or edema and Homans sign is negative, no sign of DVT Neurologic: Mental status: alertness: lethargic, affect: flat  Diagnostic Studies & Laboratory data:     Recent Radiology Findings:   CLINICAL DATA:Initial treatment strategy for mediastinal lymphadenopathy.  EXAM: NUCLEAR MEDICINE PET SKULL BASE TO THIGH  TECHNIQUE: 49 MCi F-18 FDG was injected intravenously. Full-ring PET imaging was performed from the skull base to thigh after the radiotracer. CT data was obtained and used for attenuation correction and anatomic localization.  FASTING BLOOD GLUCOSE:Value: 114 mg/dl  COMPARISON:CT chest 09/01/2015  FINDINGS: NECK  There is bilateral hypermetabolic activity with the thyroid gland. There is nodular enlargement of the RIGHT lobe of thyroid gland to 2.8 by 3.4 cm. This nodule enlargement extends from the lower pole the RIGHT gland into the RIGHT upper  paratracheal mediastinum (image 96 series 3). Diffuse metabolic activity favors thyroiditis with goiter.  CHEST  Hypermetabolic soft tissue thickening in the LEFT suprahilar lung / mediastinum measures 2.6 cm (image 117, series 3) with intense metabolic activity (insert SUV max 8.9).  There is a hypermetabolic LEFT lower paratracheal lymph node measures 13 mm with SUV max equal 5.9. No contralateral hypermetabolic lymph nodes. No supraclavicular lymph nodes.  Bilateral mild metabolic activity associated with axial lymph nodes. These  lymph nodes have normal morphology. For example peripheral lymph node in the RIGHT axilla with SUV max equal 1.9.  ABDOMEN/PELVIS  No abnormal hypermetabolic activity within the liver, pancreas, adrenal glands, or spleen.  Hypermetabolic lymph node in the medial LEFT inguinal region with measuring 21 mm short axis node on image 338, series 3 with SUV max equal 7.0.  Within the central mesentery, prominent lymph nodes surrounded by slight haziness to the mesentery (image 221, series 3). Mild metabolic activity associated with these prominent lymph nodes (SUV max of 3.2).  Spleen is normal.  SKELETON  No focal hypermetabolic activity to suggest skeletal metastasis.  IMPRESSION: 1. Hypermetabolic LEFT suprahilar mass consists primary bronchogenic carcinoma. 2. Ipsilateral nodal metastasis to the LEFT lower paratracheal nodal station. 3. No evidence of distant metastatic consistent bronchogenic carcinoma. 4. Bilateral mildly hypermetabolic axillary lymph nodes. Prominent central mesenteric adenopathy with hazy mesentery pattern. Intensely hypermetabolic LEFT inguinal lymph node. Overall the findings raise the concern for low-grade lymphoproliferative process. Recommend biopsy of the most hypermetabolic lesion (medial LEFT inguinal lymph node).   Electronically Signed ByOswald Hillock M.D. On: 09/29/2015 14:05 I have independently reviewed the above radiologic studies.  Recent Lab Findings: Lab Results  Component Value Date   WBC 10.2 11/15/2015   HGB 16.6 11/15/2015   HCT 49.6 11/15/2015   PLT 181 11/15/2015   GLUCOSE 137 (H) 11/15/2015   ALT 50 11/15/2015   AST 29 11/15/2015   NA 138 11/15/2015   K 4.3 11/15/2015   CL 108 11/15/2015   CREATININE 1.19 11/15/2015   BUN 15 11/15/2015   CO2 23 11/15/2015   INR 1.10 11/15/2015      Assessment / Plan:   Saddle pulmonary embolus March 2017  Relatively new onset of atrial fibrillation, appears  intermittent Lifelong nonsmoker New diagnosis of follicular cell low-grade lymphoma, needle biopsy of left groin Left hilar mass suggestive of non-small cell lung cancer with ipsilateral nodal metastasis by PET scan- workup so far reveals negative bronchoscopy  Discussed with patient proceeding with bronchoscopy ebus with transbronchial biopsy. Primary care and cardiology has cleared patient and aware o need to transiently hold anticoagulation. Last eliquis was Friday 3 days ago  The goals risks and alternatives of the planned surgical procedure bronchoscopy ebus with transbronchial biopsyhave been discussed with the patient in detail. The risks of the procedure including death, infection, stroke, myocardial infarction, bleeding, blood transfusion have all been discussed specifically.  I have quoted Edward Johnston a 3 % of perioperative mortality and a complication rate as high as 20 %. The patient's questions have been answered.Edward Johnston is willing  to proceed with the planned procedure. Grace Isaac MD      [address] [place] [place] [address] Office [phone]   Beeper [phone]  11/16/2015 7:15 AM

## 2015-11-16 NOTE — Brief Op Note (Signed)
      [address]       [place] [address]             [id number]     11/16/2015  9:06 AM  PATIENT:  Edward Johnston  65 y.o. male  PRE-OPERATIVE DIAGNOSIS:  LEFT LUNG MASS- Mediastinal adenopathy  POST-OPERATIVE DIAGNOSIS:  Same final path pending   PROCEDURE:  Procedure(s): VIDEO BRONCHOSCOPY WITH ENDOBRONCHIAL ULTRASOUND (N/A) LUNG BIOPSY (N/A)  SURGEON:  Surgeon(s) and Role:    * Grace Isaac, MD - Primary   ANESTHESIA:   general  EBL:  Total I/O In: 1050 [I.V.:1050] Out: 6 [Blood:6]  BLOOD ADMINISTERED:none  DRAINS: none   LOCAL MEDICATIONS USED:  NONE  SPECIMEN:  Source of Specimen:  #7, #4l, 10L lymph nodes   DISPOSITION OF SPECIMEN:  PATHOLOGY  COUNTS:  YES  DICTATION: .Dragon Dictation  PLAN OF CARE: Discharge to home after PACU  PATIENT DISPOSITION:  PACU - hemodynamically stable.   Delay start of Pharmacological VTE agent (>24hrs) due to surgical blood loss or risk of bleeding: yes

## 2015-11-16 NOTE — Discharge Instructions (Signed)
Flexible Bronchoscopy, Care After Refer to this sheet in the next few weeks. These instructions provide you with information on caring for yourself after your procedure. Your health care provider may also give you more specific instructions. Your treatment has been planned according to current medical practices, but problems sometimes occur. Call your health care provider if you have any problems or questions after your procedure.  WHAT TO EXPECT AFTER THE PROCEDURE It is normal to have the following symptoms for 24-48 hours after the procedure:   Increased cough.  Low-grade fever.  Sore throat or hoarse voice.  Small streaks of blood in your thick spit (sputum) if tissue samples were taken (biopsy). HOME CARE INSTRUCTIONS   Do not eat or drink anything for 2 hours after your procedure. Your nose and throat were numbed by medicine. If you try to eat or drink before the medicine wears off, food or drink could go into your lungs or you could burn yourself. After the numbness is gone and your cough and gag reflexes have returned, you may eat soft food and drink liquids slowly.   The day after the procedure, you can go back to your normal diet.   You may resume normal activities.   Keep all follow-up visits as directed by your health care provider. It is important to keep all your appointments, especially if tissue samples were taken for testing (biopsy). SEEK IMMEDIATE MEDICAL CARE IF:   You have increasing shortness of breath.   You become light-headed or faint.   You have chest pain.   You have any new concerning symptoms.  You cough up more than a small amount of blood.  The amount of blood you cough up increases. MAKE SURE YOU:  Understand these instructions.  Will watch your condition.  Will get help right away if you are not doing well or get worse.   This information is not intended to replace advice given to you by your health care provider. Make sure you discuss  any questions you have with your health care provider.   Document Released: 10/07/2004 Document Revised: 04/10/2014 Document Reviewed: 11/22/2012 Elsevier Interactive Patient Education [employer].

## 2015-11-16 NOTE — Transfer of Care (Signed)
Immediate Anesthesia Transfer of Care Note  Patient: Edward Johnston  Procedure(s) Performed: Procedure(s): VIDEO BRONCHOSCOPY WITH ENDOBRONCHIAL ULTRASOUND (N/A) Biopsy of node 7,4L and 10L (N/A)  Patient Location: PACU  Anesthesia Type:General  Level of Consciousness: awake, alert  and oriented  Airway & Oxygen Therapy: Patient Spontanous Breathing and Patient connected to face mask oxygen  Post-op Assessment: Report given to RN, Post -op Vital signs reviewed and stable and Patient moving all extremities X 4  Post vital signs: Reviewed and stable  Last Vitals:  Vitals:   11/16/15 0621  BP: 101/75  Pulse: 76  Resp: 20  Temp: 37 C    Last Pain:  Vitals:   11/16/15 0621  TempSrc: Oral         Complications: No apparent anesthesia complications

## 2015-11-16 NOTE — Anesthesia Procedure Notes (Signed)
Procedure Name: Intubation Date/Time: 11/16/2015 7:32 AM Performed by: Neldon Newport Pre-anesthesia Checklist: Timeout performed, Patient being monitored, Suction available, Emergency Drugs available and Patient identified Patient Re-evaluated:Patient Re-evaluated prior to inductionOxygen Delivery Method: Circle system utilized Preoxygenation: Pre-oxygenation with 100% oxygen Intubation Type: IV induction and Rapid sequence Ventilation: Mask ventilation without difficulty Laryngoscope Size: Glidescope and 4 Grade View: Grade I Tube type: Oral Tube size: 8.5 mm Number of attempts: 1 Airway Equipment and Method: Patient positioned with wedge pillow Placement Confirmation: breath sounds checked- equal and bilateral,  positive ETCO2 and ETT inserted through vocal cords under direct vision Secured at: 23 cm Tube secured with: Tape Dental Injury: Teeth and Oropharynx as per pre-operative assessment

## 2015-11-17 ENCOUNTER — Encounter (HOSPITAL_COMMUNITY): Payer: Self-pay | Admitting: Cardiothoracic Surgery

## 2015-11-17 NOTE — Op Note (Signed)
NAME:  GERSON, FAUTH NO.:  [id number]  MEDICAL RECORD NO.:  [id number]  LOCATION:  [facility]                         FACILITY:  [facility]  PHYSICIAN:  Lanelle Bal, MD    DATE OF BIRTH:  October 31, 1950  DATE OF PROCEDURE:  11/16/2015 DATE OF DISCHARGE:  11/16/2015                              OPERATIVE REPORT   PREOPERATIVE DIAGNOSIS:  Left hilar mass and adenopathy.  POSTOPERATIVE DIAGNOSIS:  Left hilar mass and adenopathy.  Final path pending.  PROCEDURE PERFORMED:  Bronchoscopy, endobronchial ultrasound with transbronchial biopsies of #7, #4L and #10L lymph nodes.  SURGEON:  Lanelle Bal, MD.  BRIEF HISTORY:  The patient is a 65 year old male with significant underlying pulmonary disease and cardiac problems including chronic atrial fibrillation.  Six months ago, he had a massive saddle PE.  He has been maintained on anticoagulation both for PE and because of chronic atrial fibrillation.  The patient on followup CT scans was noted to have an enlarging left hilar mass and adenopathy.  PET scan was performed.  The patient was seen by Dr. Glade Lloyd in [facility], and bronchoscopy was performed which he did not see any significant lesions. The patient was referred for EBUS to attempt biopsying of mediastinal nodes.  After Cardiology clearance and holding the patient's Eliquis for 3 days, we proceeded with diagnostic evaluation.  Risks and options were discussed with the patient and his wife in detail, and he was agreeable with proceeding.  DESCRIPTION OF PROCEDURE:  The patient underwent general endotracheal anesthesia without incident with 8.5-French endotracheal tube. Appropriate time-out was performed.  We then proceeded with fiberoptic bronchoscopy with a 2.8 mm videobronchoscope to the subsegmental level, both left and right tracheobronchial trees.  Photographs were taken and placed in the epic media tab.  No endobronchial lesions were noted.  The visual scope  was removed, and the EBUS scope placed.  There was some enlargement of #7 nodes although on PET scan, these were not hypermetabolic.  Several passes with biopsy needle were done with aspirations of the 7 nodes, appropriately labeled.  We then proceeded with the EBUS scope to the 4L region.  These nodes had been noted to be hypermetabolic on scan and were slightly enlarged. Multiple 4-5 passes with the biopsy needle were done in this area, and submitted labeled 4L lymph nodes.  We then positioned the scope further into the left tracheobronchial tree in the vicinity of the bifurcation of the upper and lower lobe. Multiple passes of lymph nodes in this area were performed, and labeled 10L nodes.  There was minimal bleeding evident.  The scopes were then removed.  Preliminary cytology noted no malignancy.  Remaining saddle-like material was sent for permanent sections.  The patient tolerated the procedure without obvious complication, was extubated in the operating room and transferred to the recovery room for further postoperative observation.     Lanelle Bal, MD     EG/MEDQ  D:  11/17/2015  T:  11/17/2015  Job:  [id number]  cc:   Marcy Panning, M.D.

## 2015-11-23 ENCOUNTER — Other Ambulatory Visit: Payer: Self-pay | Admitting: *Deleted

## 2015-11-23 DIAGNOSIS — F419 Anxiety disorder, unspecified: Secondary | ICD-10-CM | POA: Diagnosis not present

## 2015-11-23 DIAGNOSIS — Z86711 Personal history of pulmonary embolism: Secondary | ICD-10-CM | POA: Diagnosis not present

## 2015-11-23 DIAGNOSIS — F329 Major depressive disorder, single episode, unspecified: Secondary | ICD-10-CM | POA: Diagnosis not present

## 2015-11-23 DIAGNOSIS — R590 Localized enlarged lymph nodes: Secondary | ICD-10-CM | POA: Diagnosis not present

## 2015-11-23 DIAGNOSIS — C8305 Small cell B-cell lymphoma, lymph nodes of inguinal region and lower limb: Secondary | ICD-10-CM | POA: Diagnosis not present

## 2015-11-23 DIAGNOSIS — R918 Other nonspecific abnormal finding of lung field: Secondary | ICD-10-CM

## 2015-11-23 DIAGNOSIS — R531 Weakness: Secondary | ICD-10-CM | POA: Diagnosis not present

## 2015-12-13 ENCOUNTER — Other Ambulatory Visit: Payer: Self-pay | Admitting: Cardiothoracic Surgery

## 2015-12-13 DIAGNOSIS — F411 Generalized anxiety disorder: Secondary | ICD-10-CM | POA: Diagnosis not present

## 2015-12-13 DIAGNOSIS — F330 Major depressive disorder, recurrent, mild: Secondary | ICD-10-CM | POA: Diagnosis not present

## 2015-12-14 DIAGNOSIS — H6120 Impacted cerumen, unspecified ear: Secondary | ICD-10-CM | POA: Diagnosis not present

## 2015-12-14 DIAGNOSIS — Z0000 Encounter for general adult medical examination without abnormal findings: Secondary | ICD-10-CM | POA: Diagnosis not present

## 2015-12-14 DIAGNOSIS — E7800 Pure hypercholesterolemia, unspecified: Secondary | ICD-10-CM | POA: Diagnosis not present

## 2015-12-14 DIAGNOSIS — Z23 Encounter for immunization: Secondary | ICD-10-CM | POA: Diagnosis not present

## 2015-12-14 DIAGNOSIS — R42 Dizziness and giddiness: Secondary | ICD-10-CM | POA: Diagnosis not present

## 2015-12-16 ENCOUNTER — Encounter: Payer: Self-pay | Admitting: Cardiothoracic Surgery

## 2015-12-16 ENCOUNTER — Ambulatory Visit
Admission: RE | Admit: 2015-12-16 | Discharge: 2015-12-16 | Disposition: A | Discharge status: Home / Self Care | Payer: PPO | Source: Ambulatory Visit | Attending: Cardiothoracic Surgery | Admitting: Cardiothoracic Surgery

## 2015-12-16 ENCOUNTER — Ambulatory Visit (INDEPENDENT_AMBULATORY_CARE_PROVIDER_SITE_OTHER): Payer: PPO | Admitting: Cardiothoracic Surgery

## 2015-12-16 VITALS — BP 140/98 | HR 50 | Resp 20 | Ht 74.0 in | Wt 292.0 lb

## 2015-12-16 DIAGNOSIS — R222 Localized swelling, mass and lump, trunk: Secondary | ICD-10-CM | POA: Diagnosis not present

## 2015-12-16 DIAGNOSIS — R918 Other nonspecific abnormal finding of lung field: Secondary | ICD-10-CM | POA: Diagnosis not present

## 2015-12-16 NOTE — Progress Notes (Signed)
[address]       [place] [address]             [phone]                    Michail Lingo Taylor Medical Record #[id number] Date of Birth: 1951-02-10  Referring: Dionisio David, MD Primary Care: Angelina Sheriff., MD  Chief Complaint:    Chief Complaint  Patient presents with  . Mediastinal Mass    f/u from EBUS andtransbronchial biopsies of #7, #4L and #10L lymph nodes, CT Chest -Super D    History of Present Illness:    Edward Johnston 65 y.o. male is seen in the office  today after   bronchoscopy and ebus with transbronchial biopsy of mediastinal nodes. The final path  Diagnosis FINE NEEDLE ASPIRATION, ENDOSCOPIC EBUS, 10L (SPECIMEN 3 OF 4 COLLECTED 11-16-2015) NO MALIGNANT CELLS IDENTIFIED.   Patient was recently diagnosed with a low-grade lymphoma from a right groin biopsy. He presented to [facility] in March 2017 with saddle pulmonary embolus confirmed on CT scan . A second CT scan of the chest was done in May 2017 when he presented for routine visit to primary care was noted to be tachycardic, on this follow-up scan is noted to have a left hilar mass since we underwent bronchoscopy done by Dr Glade Lloyd in [facility] No endobronchial lesions were noted, the patient's Eliquist  was resumed.. On 6/ 28 he had a PET scan which showed left hypermetabolic suprahilar mass suggestive of primary carcinoma the lung and ipsilateral nodal metastasis to the left lower paratracheal nodes is also noted to have bilateral mildly hypermetabolic nodes on July 3 needle biopsy was performed a right inguinal node revealing follicular lymphoma low-grade.    The patient's overall activity level is extremely low spending more than 75% of his time sitting in the chair at home. He notes he gets short of breath with minimal activity such as just walking around in the yard. , He's been seen by cardiology when he had presented several months ago with rapid atrial  fibrillation. He has no history of known cardiac disease previously  Patient is a lifelong nonsmoker    Gwenevere Ghazi, MD - 10/28/2015 8:22 AM EDT Procedure: Bronchoscopy with endobronchial biopsy of the left upper lobe along with washing of the left upper lobe Indication: 65 year old male who is being evaluated for a hilar mass density that is positive on PET scan patient was recently diagnosed with lymphoma from a inguinal biopsy Performed by ; Gwenevere Ghazi MD Anesthetic: Versed 4 mg IV, morphine 8 mg IV and instillation of 2% and 1% lidocaine via bronchoscope Findings: Bronchoscope was passed on patient right nasal orifice down to the nasal passage. Vocal cords were visualized and appears to be normal. Vocal cords was anesthetized with 2% lidocaine. After the vocal cords were properly anesthetized bronchoscope passed beyond the vocal cords down to patient trachea. Rest of the airway was anesthetized with 1% lidocaine. As noted trachea appears to be normal. Carina appears to be normal. Subsequently bronchoscope was passed down the right tracheobronchial tree the right mainstem appears to be normal. Right upper lobe apical anterior and posterior segment were visualized and appears to be normal. Rhonchus intermedius was normal. Right middle lobe and its segments were visualized and appears be normal. Right lower lobe and all its basal segments and including superior segment of the right upper lobe on appears be normal  there was no loss mass lesion seen. Subsequently the bronchoscope was passed down the left tracheobronchial tree. Left mainstem appears to be normal. Left upper lobe was visualized and appears to be normal except there was a regular rate noted in that the orifice of the left upper lobe. The lingula lobe was normal. The left lower lobe and is being sent on appears be normal. Subsequently washes was performed of the left tracheobronchial tree particularly the left upper lobe. The area of  radiation that was noted (see illustration) was biopsy. Specimens was collected at collected bronchoscope was removed from patient's airway. Specimen: All being sent for cytology and passed Impression: Left hilar density that was positive on PET scan with essentially normal bronchoscopy examination.        Current Activity/ Functional Status:  Patient is independent with mobility/ambulation, transfers, ADL's, IADL's.   Zubrod Score: At the time of surgery this patient's most appropriate activity status/level should be described as: '[]'$     0    Normal activity, no symptoms '[]'$     1    Restricted in physical strenuous activity but ambulatory, able to do out light work '[x]'$     2    Ambulatory and capable of self care, unable to do work activities, up and about               >50 % of waking hours                              '[]'$     3    Only limited self care, in bed greater than 50% of waking hours '[]'$     4    Completely disabled, no self care, confined to bed or chair '[]'$     5    Moribund   Past Medical History:  Diagnosis Date  . Anxiety   . Complication of anesthesia   . Depression   . Dysrhythmia   . Hypertension    due to psych med  . Incontinence   . Persistent atrial fibrillation ([place])   . PONV (postoperative nausea and vomiting)   . Shortness of breath dyspnea     Past Surgical History:  Procedure Laterality Date  .  aneurysm clip    . BRAIN SURGERY  1989   aneurysm clipping- [facility]  . JOINT REPLACEMENT    . LYMPH NODE BIOPSY N/A 11/16/2015   Procedure: Biopsy of node 7,4L and 10L;  Surgeon: Grace Isaac, MD;  Location: [facility];  Service: Thoracic;  Laterality: N/A;  . PROSTATECTOMY    . REPLACEMENT TOTAL KNEE    . VIDEO BRONCHOSCOPY WITH ENDOBRONCHIAL ULTRASOUND N/A 11/16/2015   Procedure: VIDEO BRONCHOSCOPY WITH ENDOBRONCHIAL ULTRASOUND;  Surgeon: Grace Isaac, MD;  Location: [facility] OR;  Service: Thoracic;  Laterality: N/A;    Family History  Problem Relation Age  of Onset  . Depression Sister     Social History   Social History  . Marital status: Married    Spouse name: N/A  . Number of children: N/A  . Years of education: N/A   Occupational History  . Retired previously worked on gradual    Social History Main Topics  . Smoking status: Never Smoker  . Smokeless tobacco: Not on file  . Alcohol use No  . Drug use: No  . Sexual activity: Not Currently   Other Topics Concern  . Not on file   Social History Narrative  .  No narrative on file    History  Smoking Status  . Never Smoker  Smokeless Tobacco  . Never Used    History  Alcohol Use No     Allergies  Allergen Reactions  . No Known Allergies     Current Outpatient Prescriptions  Medication Sig Dispense Refill  . amiodarone (PACERONE) 200 MG tablet Take 200 mg by mouth daily.    Marland Kitchen apixaban (ELIQUIS) 5 MG TABS tablet Take 5 mg by mouth 2 (two) times daily.    . ARIPiprazole (ABILIFY) 2 MG tablet Take 1 mg by mouth at bedtime.     Marland Kitchen aspirin EC 81 MG tablet Take 81 mg by mouth daily.    Marland Kitchen atenolol (TENORMIN) 25 MG tablet Take 25 mg by mouth 2 (two) times daily.     . benztropine (COGENTIN) 1 MG tablet Take 0.5 mg by mouth at bedtime.     Marland Kitchen buPROPion (WELLBUTRIN XL) 150 MG 24 hr tablet Take 300 mg by mouth daily.     . clorazepate (TRANXENE) 7.5 MG tablet Take 3.75 mg by mouth 2 (two) times daily as needed for anxiety.     Marland Kitchen latanoprost (XALATAN) 0.005 % ophthalmic solution Place 1 drop into the left eye at bedtime.    Marland Kitchen omeprazole (PRILOSEC) 20 MG capsule Take 20 mg by mouth daily.    . Vilazodone HCl (VIIBRYD) 40 MG TABS Take 40 mg by mouth daily.      No current facility-administered medications for this visit.       Review of Systems:     Cardiac Review of Systems: Y or N  Chest Pain Florencio.Farrier    ]  Resting SOB [  n ] Exertional SOB  Blue.Reese  ]  Orthopnea Blue.Reese  ]   Pedal Edema Florencio.Farrier   ]    Palpitations Florencio.Farrier  ] Syncope  [ n ]   Presyncope [ n  ]  General Review of Systems: [Y]  = yes [  ]=no Constitional: recent weight change [ n ];  Wt loss over the last 3 months [   ] anorexia [  ]; fatigue [  ]; nausea [  ]; night sweats [  ]; fever [  ]; or chills [  ];          Dental: poor dentition[  ]; Last Dentist visit:   Eye : blurred vision [  ]; diplopia [   ]; vision changes [  ];  Amaurosis fugax[  ]; Resp: cough [  ];  wheezing[  ];  hemoptysis[  ]; shortness of breath[  ]; paroxysmal nocturnal dyspnea[  ]; dyspnea on exertion[  ]; or orthopnea[  ];  GI:  gallstones[  ], vomiting[  ];  dysphagia[  ]; melena[  ];  hematochezia [  ]; heartburn[  ];   Hx of  Colonoscopy[  ]; GU: kidney stones [  ]; hematuria[  ];   dysuria [  ];  nocturia[  ];  history of     obstruction [  ]; urinary frequency [  ]             Skin: rash, swelling[  ];, hair loss[  ];  peripheral edema[  ];  or itching[  ]; Musculosketetal: myalgias[  ];  joint swelling[  ];  joint erythema[  ];  joint pain[  ];  back pain[  ];  Heme/Lymph: bruising[y  ];  bleeding[  ];  anemia[  ];  Neuro: TIA[  ];  headaches[  ];  stroke[  ];  vertigo[  ];  seizures[  ];   paresthesias[  ];  difficulty walking[  ];  Psych:depression[ y ]; anxiety[  ];  Endocrine: diabetes[  ];  thyroid dysfunction[  ];  Immunizations: Flu up to date [  ?]; Pneumococcal up to date [ > ];  Other:  Physical Exam: BP (!) 140/98 (BP Location: Right Arm, Cuff Size: Large)   Pulse (!) 50   Resp 20   Ht '6\' 2"'$  (1.88 m)   Wt 292 lb (132.5 kg)   SpO2 96% Comment: RA  BMI 37.49 kg/m   PHYSICAL EXAMINATION: General appearance: slowed mentation Head: Normocephalic, without obvious abnormality, atraumatic Neck: no adenopathy, no carotid bruit, no JVD, supple, symmetrical, trachea midline and thyroid not enlarged, symmetric, no tenderness/mass/nodules Lymph nodes: Cervical, supraclavicular, and axillary nodes normal. and left goin node biopsyed via needle dbx Resp: diminished breath sounds bibasilar Back: symmetric, no curvature. ROM normal.  No CVA tenderness. Cardio: regular rate and rhythm, S1, S2 normal, no murmur, click, rub or gallop GI: soft, non-tender; bowel sounds normal; no masses,  no organomegaly Extremities: extremities normal, atraumatic, no cyanosis or edema and Homans sign is negative, no sign of DVT Neurologic: Mental status: alertness: lethargic, affect: flat  Diagnostic Studies & Laboratory data:     Recent Radiology Findings:  Ct Super D Chest Wo Contrast  Result Date: 12/16/2015 CLINICAL DATA:  Hilar mass.  Hypertension.  History of aneurysm. EXAM: CT CHEST WITHOUT CONTRAST TECHNIQUE: Multidetector CT imaging of the chest was performed using thin slice collimation for electromagnetic bronchoscopy planning purposes, without intravenous contrast. COMPARISON:  Multiple exams, including 09/29/2015 FINDINGS: Cardiovascular: Atherosclerotic calcification of the aortic arch. Mild cardiomegaly. Mediastinum/Nodes: Mildly hypodense 3.7 by 2.5 cm right thyroid nodule extends in the upper mediastinum, metabolic activity similar to the rest of the thyroid gland on recent PET-CT. Left suprahilar mass measuring about 2.0 cm in short axis diameter, previously 2.7 cm on 09/01/2015. Partially obscured by surrounding vasculature. Adjacent AP window lymph node 1.0 cm in short axis on image 40/3, previously 1.1 cm. Left lower paratracheal node 1.3 cm in short axis on image 39/3, previously 1.5 cm. The hilar lymph nodes shown previously are less well seen due to the lack of IV contrast. Upper normal size bilateral axillary lymph nodes. Lungs/Pleura: Despite efforts by the technologist and patient, motion artifact is present on today's exam and could not be eliminated. This reduces exam sensitivity and specificity. Left suprahilar mass is noted above. No new pulmonary nodule identified. Upper Abdomen: Unremarkable Musculoskeletal: Thoracic spondylosis with multilevel spurring. IMPRESSION: 1. The left suprahilar mass and adjacent lymph nodes  appear smaller than on the prior exam, but still abnormally enlarged. 2. Hypodense right inferior thyroid nodule, similar to prior exams. Electronically Signed   By: Van Clines M.D.   On: 12/16/2015 14:53    CLINICAL DATA:Initial treatment strategy for mediastinal lymphadenopathy.  EXAM: NUCLEAR MEDICINE PET SKULL BASE TO THIGH  TECHNIQUE: 41 MCi F-18 FDG was injected intravenously. Full-ring PET imaging was performed from the skull base to thigh after the radiotracer. CT data was obtained and used for attenuation correction and anatomic localization.  FASTING BLOOD GLUCOSE:Value: 114 mg/dl  COMPARISON:CT chest 09/01/2015  FINDINGS: NECK  There is bilateral hypermetabolic activity with the thyroid gland. There is nodular enlargement of the RIGHT lobe of thyroid gland to 2.8 by 3.4 cm. This nodule enlargement extends from the lower pole the RIGHT gland into the RIGHT upper  paratracheal mediastinum (image 96 series 3). Diffuse metabolic activity favors thyroiditis with goiter.  CHEST  Hypermetabolic soft tissue thickening in the LEFT suprahilar lung / mediastinum measures 2.6 cm (image 117, series 3) with intense metabolic activity (insert SUV max 8.9).  There is a hypermetabolic LEFT lower paratracheal lymph node measures 13 mm with SUV max equal 5.9. No contralateral hypermetabolic lymph nodes. No supraclavicular lymph nodes.  Bilateral mild metabolic activity associated with axial lymph nodes. These lymph nodes have normal morphology. For example peripheral lymph node in the RIGHT axilla with SUV max equal 1.9.  ABDOMEN/PELVIS  No abnormal hypermetabolic activity within the liver, pancreas, adrenal glands, or spleen.  Hypermetabolic lymph node in the medial LEFT inguinal region with measuring 21 mm short axis node on image 338, series 3 with SUV max equal 7.0.  Within the central mesentery, prominent lymph nodes surrounded by slight haziness  to the mesentery (image 221, series 3). Mild metabolic activity associated with these prominent lymph nodes (SUV max of 3.2).  Spleen is normal.  SKELETON  No focal hypermetabolic activity to suggest skeletal metastasis.  IMPRESSION: 1. Hypermetabolic LEFT suprahilar mass consists primary bronchogenic carcinoma. 2. Ipsilateral nodal metastasis to the LEFT lower paratracheal nodal station. 3. No evidence of distant metastatic consistent bronchogenic carcinoma. 4. Bilateral mildly hypermetabolic axillary lymph nodes. Prominent central mesenteric adenopathy with hazy mesentery pattern. Intensely hypermetabolic LEFT inguinal lymph node. Overall the findings raise the concern for low-grade lymphoproliferative process. Recommend biopsy of the most hypermetabolic lesion (medial LEFT inguinal lymph node).   Electronically Signed ByOswald Hillock M.D. On: 09/29/2015 14:05 I have independently reviewed the above radiologic studies.  Recent Lab Findings: Lab Results  Component Value Date   WBC 10.2 11/15/2015   HGB 16.6 11/15/2015   HCT 49.6 11/15/2015   PLT 181 11/15/2015   GLUCOSE 137 (H) 11/15/2015   ALT 50 11/15/2015   AST 29 11/15/2015   NA 138 11/15/2015   K 4.3 11/15/2015   CL 108 11/15/2015   CREATININE 1.19 11/15/2015   BUN 15 11/15/2015   CO2 23 11/15/2015   INR 1.10 11/15/2015      Assessment / Plan:   Saddle pulmonary embolus March 2017 Relatively new onset of atrial fibrillation, appears intermittent Lifelong nonsmoker New diagnosis of follicular cell low-grade lymphoma, needle biopsy of left groin Left hilar mass suggestive of non-small cell lung cancer with ipsilateral nodal metastasis by PET scan- workup so far reveals negative bronchoscopy and EBUS CT today : The left suprahilar mass and adjacent lymph nodes appear smaller than on the prior exam, but still abnormally enlarged Will repeat ct 3 months , Patient would not tolerated a more extensive  , VATS  procedure to biopsy nodes . Consider repeat bronch/ebus after repeat ct in 3 months   Grace Isaac MD      [address] [place] [address] Office [phone]   Beeper [phone]  12/16/2015 3:31 PM

## 2015-12-24 DIAGNOSIS — E538 Deficiency of other specified B group vitamins: Secondary | ICD-10-CM | POA: Diagnosis not present

## 2016-01-17 DIAGNOSIS — C61 Malignant neoplasm of prostate: Secondary | ICD-10-CM | POA: Diagnosis not present

## 2016-01-20 DIAGNOSIS — R5383 Other fatigue: Secondary | ICD-10-CM | POA: Diagnosis not present

## 2016-01-20 DIAGNOSIS — R0600 Dyspnea, unspecified: Secondary | ICD-10-CM | POA: Diagnosis not present

## 2016-01-20 DIAGNOSIS — G4733 Obstructive sleep apnea (adult) (pediatric): Secondary | ICD-10-CM | POA: Diagnosis not present

## 2016-01-20 DIAGNOSIS — R918 Other nonspecific abnormal finding of lung field: Secondary | ICD-10-CM | POA: Diagnosis not present

## 2016-01-25 DIAGNOSIS — E538 Deficiency of other specified B group vitamins: Secondary | ICD-10-CM | POA: Diagnosis not present

## 2016-01-26 DIAGNOSIS — C61 Malignant neoplasm of prostate: Secondary | ICD-10-CM | POA: Diagnosis not present

## 2016-01-31 DIAGNOSIS — L918 Other hypertrophic disorders of the skin: Secondary | ICD-10-CM | POA: Diagnosis not present

## 2016-01-31 DIAGNOSIS — L578 Other skin changes due to chronic exposure to nonionizing radiation: Secondary | ICD-10-CM | POA: Diagnosis not present

## 2016-01-31 DIAGNOSIS — L821 Other seborrheic keratosis: Secondary | ICD-10-CM | POA: Diagnosis not present

## 2016-02-01 DIAGNOSIS — G4733 Obstructive sleep apnea (adult) (pediatric): Secondary | ICD-10-CM | POA: Diagnosis not present

## 2016-02-01 DIAGNOSIS — R5383 Other fatigue: Secondary | ICD-10-CM | POA: Diagnosis not present

## 2016-02-01 DIAGNOSIS — R918 Other nonspecific abnormal finding of lung field: Secondary | ICD-10-CM | POA: Diagnosis not present

## 2016-02-01 DIAGNOSIS — R0600 Dyspnea, unspecified: Secondary | ICD-10-CM | POA: Diagnosis not present

## 2016-02-11 ENCOUNTER — Other Ambulatory Visit: Payer: Self-pay | Admitting: *Deleted

## 2016-02-11 DIAGNOSIS — R918 Other nonspecific abnormal finding of lung field: Secondary | ICD-10-CM

## 2016-02-16 DIAGNOSIS — D123 Benign neoplasm of transverse colon: Secondary | ICD-10-CM | POA: Diagnosis not present

## 2016-02-16 DIAGNOSIS — Z1211 Encounter for screening for malignant neoplasm of colon: Secondary | ICD-10-CM | POA: Diagnosis not present

## 2016-02-16 DIAGNOSIS — K635 Polyp of colon: Secondary | ICD-10-CM | POA: Diagnosis not present

## 2016-02-16 DIAGNOSIS — Z86010 Personal history of colonic polyps: Secondary | ICD-10-CM | POA: Diagnosis not present

## 2016-02-29 DIAGNOSIS — D510 Vitamin B12 deficiency anemia due to intrinsic factor deficiency: Secondary | ICD-10-CM | POA: Diagnosis not present

## 2016-02-29 DIAGNOSIS — Z86711 Personal history of pulmonary embolism: Secondary | ICD-10-CM | POA: Diagnosis not present

## 2016-02-29 DIAGNOSIS — C61 Malignant neoplasm of prostate: Secondary | ICD-10-CM | POA: Diagnosis not present

## 2016-02-29 DIAGNOSIS — F418 Other specified anxiety disorders: Secondary | ICD-10-CM | POA: Diagnosis not present

## 2016-02-29 DIAGNOSIS — R590 Localized enlarged lymph nodes: Secondary | ICD-10-CM | POA: Diagnosis not present

## 2016-02-29 DIAGNOSIS — C8305 Small cell B-cell lymphoma, lymph nodes of inguinal region and lower limb: Secondary | ICD-10-CM | POA: Diagnosis not present

## 2016-02-29 DIAGNOSIS — R918 Other nonspecific abnormal finding of lung field: Secondary | ICD-10-CM | POA: Diagnosis not present

## 2016-03-07 DIAGNOSIS — F411 Generalized anxiety disorder: Secondary | ICD-10-CM | POA: Diagnosis not present

## 2016-03-07 DIAGNOSIS — F330 Major depressive disorder, recurrent, mild: Secondary | ICD-10-CM | POA: Diagnosis not present

## 2016-03-08 DIAGNOSIS — G4733 Obstructive sleep apnea (adult) (pediatric): Secondary | ICD-10-CM | POA: Diagnosis not present

## 2016-03-15 DIAGNOSIS — R5383 Other fatigue: Secondary | ICD-10-CM | POA: Diagnosis not present

## 2016-03-15 DIAGNOSIS — R918 Other nonspecific abnormal finding of lung field: Secondary | ICD-10-CM | POA: Diagnosis not present

## 2016-03-15 DIAGNOSIS — R0600 Dyspnea, unspecified: Secondary | ICD-10-CM | POA: Diagnosis not present

## 2016-03-15 DIAGNOSIS — G4733 Obstructive sleep apnea (adult) (pediatric): Secondary | ICD-10-CM | POA: Diagnosis not present

## 2016-03-16 ENCOUNTER — Ambulatory Visit
Admission: RE | Admit: 2016-03-16 | Discharge: 2016-03-16 | Disposition: A | Discharge status: Home / Self Care | Payer: PPO | Source: Ambulatory Visit | Attending: Cardiothoracic Surgery | Admitting: Cardiothoracic Surgery

## 2016-03-16 ENCOUNTER — Ambulatory Visit (INDEPENDENT_AMBULATORY_CARE_PROVIDER_SITE_OTHER): Payer: PPO | Admitting: Cardiothoracic Surgery

## 2016-03-16 ENCOUNTER — Encounter: Payer: Self-pay | Admitting: Cardiothoracic Surgery

## 2016-03-16 VITALS — BP 152/99 | HR 101 | Resp 16 | Ht 74.0 in | Wt 192.0 lb

## 2016-03-16 DIAGNOSIS — R918 Other nonspecific abnormal finding of lung field: Secondary | ICD-10-CM

## 2016-03-16 DIAGNOSIS — R222 Localized swelling, mass and lump, trunk: Secondary | ICD-10-CM | POA: Diagnosis not present

## 2016-03-16 NOTE — Progress Notes (Signed)
[address]       [place] [address]             [phone]                    Darlene Whack [place] Medical Record #[id number] Date of Birth: 1950/04/26  Referring: Nicholes Mango, MD Primary Care: [facility] Angelique Blonder., MD  Chief Complaint:    Chief Complaint  Patient presents with  . Mediastinal Mass    3 month f/u with Chest CT - Super D    History of Present Illness:    Edward Johnston 65 y.o. male is seen in the office  today after   bronchoscopy and ebus with transbronchial biopsy of mediastinal nodes Done August 2017. A follow-up CT scan was done 12/16/2015.  The final path  Diagnosis FINE NEEDLE ASPIRATION, ENDOSCOPIC EBUS, 10L (SPECIMEN 3 OF 4 COLLECTED 11-16-2015) NO MALIGNANT CELLS IDENTIFIED.   Patient was recently diagnosed with a low-grade lymphoma from a right groin biopsy. He presented to [facility] in March 2017 with saddle pulmonary embolus confirmed on CT scan . A second CT scan of the chest was done in May 2017 when he presented for routine visit to primary care was noted to be tachycardic, on this follow-up scan is noted to have a left hilar mass since we underwent bronchoscopy done by Dr Glade Lloyd in [facility]. No endobronchial lesions were noted, the patient's Eliquist  was resumed.. On 6/ 28 he had a PET scan which showed left hypermetabolic suprahilar mass suggestive of primary carcinoma the lung and ipsilateral nodal metastasis to the left lower paratracheal nodes is also noted to have bilateral mildly hypermetabolic nodes on July 3 needle biopsy was performed a right inguinal node revealing follicular lymphoma low-grade.    The patient's overall activity level is extremely low spending more than 75% of his time sitting in the chair at home. He notes he gets short of breath with minimal activity such as just walking around in the yard. , He's been seen by cardiology when he had presented several months ago with rapid atrial  fibrillation. He has no history of known cardiac disease previously  Patient is a lifelong nonsmoker    Gwenevere Ghazi, MD - 10/28/2015 8:22 AM EDT Procedure: Bronchoscopy with endobronchial biopsy of the left upper lobe along with washing of the left upper lobe Indication: 65 year old male who is being evaluated for a hilar mass density that is positive on PET scan patient was recently diagnosed with lymphoma from a inguinal biopsy Performed by ; Gwenevere Ghazi MD Anesthetic: Versed 4 mg IV, morphine 8 mg IV and instillation of 2% and 1% lidocaine via bronchoscope Findings: Bronchoscope was passed on patient right nasal orifice down to the nasal passage. Vocal cords were visualized and appears to be normal. Vocal cords was anesthetized with 2% lidocaine. After the vocal cords were properly anesthetized bronchoscope passed beyond the vocal cords down to patient trachea. Rest of the airway was anesthetized with 1% lidocaine. As noted trachea appears to be normal. Carina appears to be normal. Subsequently bronchoscope was passed down the right tracheobronchial tree the right mainstem appears to be normal. Right upper lobe apical anterior and posterior segment were visualized and appears to be normal. Rhonchus intermedius was normal. Right middle lobe and its segments were visualized and appears be normal. Right lower lobe and all its basal segments and including superior segment of the right upper lobe on  appears be normal there was no loss mass lesion seen. Subsequently the bronchoscope was passed down the left tracheobronchial tree. Left mainstem appears to be normal. Left upper lobe was visualized and appears to be normal except there was a regular rate noted in that the orifice of the left upper lobe. The lingula lobe was normal. The left lower lobe and is being sent on appears be normal. Subsequently washes was performed of the left tracheobronchial tree particularly the left upper lobe. The area of  radiation that was noted (see illustration) was biopsy. Specimens was collected at collected bronchoscope was removed from patient's airway. Specimen: All being sent for cytology and passed Impression: Left hilar density that was positive on PET scan with essentially normal bronchoscopy examination.        Current Activity/ Functional Status:  Patient is independent with mobility/ambulation, transfers, ADL's, IADL's.   Zubrod Score: At the time of surgery this patient's most appropriate activity status/level should be described as: '[]'$     0    Normal activity, no symptoms '[]'$     1    Restricted in physical strenuous activity but ambulatory, able to do out light work '[x]'$     2    Ambulatory and capable of self care, unable to do work activities, up and about               >50 % of waking hours                              '[]'$     3    Only limited self care, in bed greater than 50% of waking hours '[]'$     4    Completely disabled, no self care, confined to bed or chair '[]'$     5    Moribund   Past Medical History:  Diagnosis Date  . Anxiety   . Complication of anesthesia   . Depression   . Dysrhythmia   . Hypertension    due to psych med  . Incontinence   . Persistent atrial fibrillation ([place])   . PONV (postoperative nausea and vomiting)   . Shortness of breath dyspnea     Past Surgical History:  Procedure Laterality Date  .  aneurysm clip    . BRAIN SURGERY  1989   aneurysm clipping- [facility]  . JOINT REPLACEMENT    . LYMPH NODE BIOPSY N/A 11/16/2015   Procedure: Biopsy of node 7,4L and 10L;  Surgeon: Grace Isaac, MD;  Location: [place];  Service: Thoracic;  Laterality: N/A;  . PROSTATECTOMY    . REPLACEMENT TOTAL KNEE    . VIDEO BRONCHOSCOPY WITH ENDOBRONCHIAL ULTRASOUND N/A 11/16/2015   Procedure: VIDEO BRONCHOSCOPY WITH ENDOBRONCHIAL ULTRASOUND;  Surgeon: Grace Isaac, MD;  Location: [facility];  Service: Thoracic;  Laterality: N/A;    Family History  Problem Relation Age  of Onset  . Depression Sister     Social History   Social History  . Marital status: Married    Spouse name: N/A  . Number of children: N/A  . Years of education: N/A   Occupational History  . Retired previously worked on gradual    Social History Main Topics  . Smoking status: Never Smoker  . Smokeless tobacco: Not on file  . Alcohol use No  . Drug use: No  . Sexual activity: Not Currently   Other Topics Concern  . Not on file   Social  History Narrative  . No narrative on file    History  Smoking Status  . Never Smoker  Smokeless Tobacco  . Never Used    History  Alcohol Use No     Allergies  Allergen Reactions  . No Known Allergies     Current Outpatient Prescriptions  Medication Sig Dispense Refill  . amiodarone (PACERONE) 200 MG tablet Take 200 mg by mouth daily.    Marland Kitchen apixaban (ELIQUIS) 5 MG TABS tablet Take 5 mg by mouth 2 (two) times daily.    . ARIPiprazole (ABILIFY) 2 MG tablet Take 1 mg by mouth at bedtime.     Marland Kitchen aspirin EC 81 MG tablet Take 81 mg by mouth daily.    Marland Kitchen atenolol (TENORMIN) 25 MG tablet Take 25 mg by mouth 2 (two) times daily.     . benztropine (COGENTIN) 1 MG tablet Take 0.5 mg by mouth at bedtime.     Marland Kitchen buPROPion (WELLBUTRIN XL) 150 MG 24 hr tablet Take 300 mg by mouth daily.     . clorazepate (TRANXENE) 7.5 MG tablet Take 3.75 mg by mouth 2 (two) times daily as needed for anxiety.     Marland Kitchen latanoprost (XALATAN) 0.005 % ophthalmic solution Place 1 drop into the left eye at bedtime.    Marland Kitchen omeprazole (PRILOSEC) 20 MG capsule Take 20 mg by mouth daily.    . Vilazodone HCl (VIIBRYD) 40 MG TABS Take 40 mg by mouth daily.      No current facility-administered medications for this visit.       Review of Systems:     Cardiac Review of Systems: Y or N  Chest Pain Florencio.Farrier    ]  Resting SOB [  n ] Exertional SOB  Blue.Reese  ]  Orthopnea Blue.Reese  ]   Pedal Edema Florencio.Farrier   ]    Palpitations Florencio.Farrier  ] Syncope  [ n ]   Presyncope [ n  ]  General Review of Systems: [Y]  = yes [  ]=no Constitional: recent weight change [ n ];  Wt loss over the last 3 months [   ] anorexia [  ]; fatigue [  ]; nausea [  ]; night sweats [  ]; fever [  ]; or chills [  ];          Dental: poor dentition[  ]; Last Dentist visit:   Eye : blurred vision [  ]; diplopia [   ]; vision changes [  ];  Amaurosis fugax[  ]; Resp: cough [  ];  wheezing[  ];  hemoptysis[  ]; shortness of breath[  ]; paroxysmal nocturnal dyspnea[  ]; dyspnea on exertion[  ]; or orthopnea[  ];  GI:  gallstones[  ], vomiting[  ];  dysphagia[  ]; melena[  ];  hematochezia [  ]; heartburn[  ];   Hx of  Colonoscopy[  ]; GU: kidney stones [  ]; hematuria[  ];   dysuria [  ];  nocturia[  ];  history of     obstruction [  ]; urinary frequency [  ]             Skin: rash, swelling[  ];, hair loss[  ];  peripheral edema[  ];  or itching[  ]; Musculosketetal: myalgias[  ];  joint swelling[  ];  joint erythema[  ];  joint pain[  ];  back pain[  ];  Heme/Lymph: bruising[y  ];  bleeding[  ];  anemia[  ];  Neuro: TIA[  ];  headaches[  ];  stroke[  ];  vertigo[  ];  seizures[  ];   paresthesias[  ];  difficulty walking[  ];  Psych:depression[ y ]; anxiety[  ];  Endocrine: diabetes[  ];  thyroid dysfunction[  ];  Immunizations: Flu up to date [  ?]; Pneumococcal up to date [ > ];  Other:  Physical Exam: BP (!) 152/99 (BP Location: Left Arm, Patient Position: Sitting, Cuff Size: Large)   Pulse (!) 101 Comment: irreg  Resp 16   Ht '6\' 2"'$  (1.88 m)   Wt 192 lb (87.1 kg)   SpO2 99% Comment: RA  BMI 24.65 kg/m   PHYSICAL EXAMINATION: General appearance: slowed mentation Head: Normocephalic, without obvious abnormality, atraumatic Neck: no adenopathy, no carotid bruit, no JVD, supple, symmetrical, trachea midline and thyroid not enlarged, symmetric, no tenderness/mass/nodules Lymph nodes: Cervical, supraclavicular, and axillary nodes normal. and left goin node biopsyed via needle dbx Resp: diminished breath sounds  bibasilar Back: symmetric, no curvature. ROM normal. No CVA tenderness. Cardio: regular rate and rhythm, S1, S2 normal, no murmur, click, rub or gallop GI: soft, non-tender; bowel sounds normal; no masses,  no organomegaly Extremities: extremities normal, atraumatic, no cyanosis or edema and Homans sign is negative, no sign of DVT Neurologic: Mental status: alertness: lethargic, affect: flat  Diagnostic Studies & Laboratory data:     Recent Radiology Findings:  Ct Super D Chest Wo Contrast  Result Date: 03/16/2016 CLINICAL DATA:  Followup left hilar mass EXAM: CT CHEST WITHOUT CONTRAST TECHNIQUE: Multidetector CT imaging of the chest was performed using thin slice collimation for electromagnetic bronchoscopy planning purposes, without intravenous contrast. COMPARISON:  12/16/2015 FINDINGS: Cardiovascular: The heart size appears normal. Aortic atherosclerosis noted. Calcification in the LAD coronary artery noted. Left circumflex coronary artery calcification also noted. Mediastinum/Nodes: No pleural fluid. The trachea appears patent and is midline. Normal appearance of the esophagus. Nodule arising from the right lobe of thyroid gland is again noted measuring 3.4 cm, image 21 of series 3. The previous pre-vascular lymph node now measures 8 mm, image 45 of series 3. Previously 1 cm. The previous left hilar mass is difficult to re-evaluate due to lack of IV contrast material. Using the same measurement technique from 12/06/2015 the area of concern measures 1.9 cm, image 41 of series 3. Previously 2 cm. Lungs/Pleura: There is no pleural fluid identified. No atelectasis or pneumonitis. No airspace consolidation. Upper Abdomen: Normal appearance of the adrenal glands. Nonspecific soft tissue stranding within the mesenteric fat is identified. No acute abnormality identified. Musculoskeletal: There is degenerative disc disease identified within the thoracic spine. No aggressive lytic or sclerotic bone lesions  identified. IMPRESSION: 1. Further regression of previously enlarged prevascular lymph node which now measures 8 mm. 2. Difficult to re-evaluate due to lack of IV contrast material, the previously referenced left hilar soft tissue appears stable to decreased in the interval. Electronically Signed   By: Kerby Moors M.D.   On: 03/16/2016 14:01     Ct Super D Chest Wo Contrast  Result Date: 12/16/2015 CLINICAL DATA:  Hilar mass.  Hypertension.  History of aneurysm. EXAM: CT CHEST WITHOUT CONTRAST TECHNIQUE: Multidetector CT imaging of the chest was performed using thin slice collimation for electromagnetic bronchoscopy planning purposes, without intravenous contrast. COMPARISON:  Multiple exams, including 09/29/2015 FINDINGS: Cardiovascular: Atherosclerotic calcification of the aortic arch. Mild cardiomegaly. Mediastinum/Nodes: Mildly hypodense 3.7 by 2.5 cm right thyroid nodule extends in the upper mediastinum, metabolic activity similar to the  rest of the thyroid gland on recent PET-CT. Left suprahilar mass measuring about 2.0 cm in short axis diameter, previously 2.7 cm on 09/01/2015. Partially obscured by surrounding vasculature. Adjacent AP window lymph node 1.0 cm in short axis on image 40/3, previously 1.1 cm. Left lower paratracheal node 1.3 cm in short axis on image 39/3, previously 1.5 cm. The hilar lymph nodes shown previously are less well seen due to the lack of IV contrast. Upper normal size bilateral axillary lymph nodes. Lungs/Pleura: Despite efforts by the technologist and patient, motion artifact is present on today's exam and could not be eliminated. This reduces exam sensitivity and specificity. Left suprahilar mass is noted above. No new pulmonary nodule identified. Upper Abdomen: Unremarkable Musculoskeletal: Thoracic spondylosis with multilevel spurring. IMPRESSION: 1. The left suprahilar mass and adjacent lymph nodes appear smaller than on the prior exam, but still abnormally enlarged.  2. Hypodense right inferior thyroid nodule, similar to prior exams. Electronically Signed   By: Van Clines M.D.   On: 12/16/2015 14:53    CLINICAL DATA:Initial treatment strategy for mediastinal lymphadenopathy.  EXAM: NUCLEAR MEDICINE PET SKULL BASE TO THIGH  TECHNIQUE: 58 MCi F-18 FDG was injected intravenously. Full-ring PET imaging was performed from the skull base to thigh after the radiotracer. CT data was obtained and used for attenuation correction and anatomic localization.  FASTING BLOOD GLUCOSE:Value: 114 mg/dl  COMPARISON:CT chest 09/01/2015  FINDINGS: NECK  There is bilateral hypermetabolic activity with the thyroid gland. There is nodular enlargement of the RIGHT lobe of thyroid gland to 2.8 by 3.4 cm. This nodule enlargement extends from the lower pole the RIGHT gland into the RIGHT upper paratracheal mediastinum (image 96 series 3). Diffuse metabolic activity favors thyroiditis with goiter.  CHEST  Hypermetabolic soft tissue thickening in the LEFT suprahilar lung / mediastinum measures 2.6 cm (image 117, series 3) with intense metabolic activity (insert SUV max 8.9).  There is a hypermetabolic LEFT lower paratracheal lymph node measures 13 mm with SUV max equal 5.9. No contralateral hypermetabolic lymph nodes. No supraclavicular lymph nodes.  Bilateral mild metabolic activity associated with axial lymph nodes. These lymph nodes have normal morphology. For example peripheral lymph node in the RIGHT axilla with SUV max equal 1.9.  ABDOMEN/PELVIS  No abnormal hypermetabolic activity within the liver, pancreas, adrenal glands, or spleen.  Hypermetabolic lymph node in the medial LEFT inguinal region with measuring 21 mm short axis node on image 338, series 3 with SUV max equal 7.0.  Within the central mesentery, prominent lymph nodes surrounded by slight haziness to the mesentery (image 221, series 3). Mild metabolic activity  associated with these prominent lymph nodes (SUV max of 3.2).  Spleen is normal.  SKELETON  No focal hypermetabolic activity to suggest skeletal metastasis.  IMPRESSION: 1. Hypermetabolic LEFT suprahilar mass consists primary bronchogenic carcinoma. 2. Ipsilateral nodal metastasis to the LEFT lower paratracheal nodal station. 3. No evidence of distant metastatic consistent bronchogenic carcinoma. 4. Bilateral mildly hypermetabolic axillary lymph nodes. Prominent central mesenteric adenopathy with hazy mesentery pattern. Intensely hypermetabolic LEFT inguinal lymph node. Overall the findings raise the concern for low-grade lymphoproliferative process. Recommend biopsy of the most hypermetabolic lesion (medial LEFT inguinal lymph node).   Electronically Signed ByOswald Hillock M.D. On: 09/29/2015 14:05 I have independently reviewed the above radiologic studies.  Recent Lab Findings: Lab Results  Component Value Date   WBC 10.2 11/15/2015   HGB 16.6 11/15/2015   HCT 49.6 11/15/2015   PLT 181 11/15/2015   GLUCOSE 137 (H)  11/15/2015   ALT 50 11/15/2015   AST 29 11/15/2015   NA 138 11/15/2015   K 4.3 11/15/2015   CL 108 11/15/2015   CREATININE 1.19 11/15/2015   BUN 15 11/15/2015   CO2 23 11/15/2015   INR 1.10 11/15/2015      Assessment / Plan:   [place] pulmonary embolus March 2017 Relatively new onset of atrial fibrillation, appears intermittent Lifelong nonsmoker Diagnosis of follicular cell low-grade lymphoma  By needle biopsy of left groin Left hilar mass suggestive of non-small cell lung cancer with ipsilateral nodal metastasis by PET scan- workup so far reveals negative bronchoscopy and EBUS- Follow-up CT scan done today  Reveals Further regression of previously enlarged prevascular lymph node which now measures 8 mm. 2. Difficult to re-evaluate due to lack of IV contrast material, the previously referenced left hilar soft tissue appears stable to  decreased in the interval  With the patient's very limited functional status, after discussing the CT results with he and his wife with regression of size of nodes and left hilar soft tissue mass we'll continue to follow with serial CT scans rather than surgical intervention at this time. I plan to see back in 3 months with this CT of the chest with and without contrast.  Grace Isaac MD      [address] [place] [address] Office [phone]   Beeper [phone]  03/16/2016 2:16 PM

## 2016-03-29 DIAGNOSIS — E538 Deficiency of other specified B group vitamins: Secondary | ICD-10-CM | POA: Diagnosis not present

## 2016-04-27 DIAGNOSIS — I4891 Unspecified atrial fibrillation: Secondary | ICD-10-CM | POA: Diagnosis not present

## 2016-04-27 DIAGNOSIS — I4892 Unspecified atrial flutter: Secondary | ICD-10-CM | POA: Diagnosis not present

## 2016-05-02 DIAGNOSIS — E538 Deficiency of other specified B group vitamins: Secondary | ICD-10-CM | POA: Diagnosis not present

## 2016-05-11 DIAGNOSIS — I4891 Unspecified atrial fibrillation: Secondary | ICD-10-CM | POA: Diagnosis not present

## 2016-05-11 DIAGNOSIS — I4892 Unspecified atrial flutter: Secondary | ICD-10-CM | POA: Diagnosis not present

## 2016-05-15 DIAGNOSIS — H40052 Ocular hypertension, left eye: Secondary | ICD-10-CM | POA: Diagnosis not present

## 2016-05-18 ENCOUNTER — Other Ambulatory Visit: Payer: Self-pay | Admitting: *Deleted

## 2016-05-22 ENCOUNTER — Other Ambulatory Visit: Payer: Self-pay | Admitting: *Deleted

## 2016-05-22 DIAGNOSIS — R911 Solitary pulmonary nodule: Secondary | ICD-10-CM

## 2016-05-25 DIAGNOSIS — I4891 Unspecified atrial fibrillation: Secondary | ICD-10-CM | POA: Diagnosis not present

## 2016-05-25 DIAGNOSIS — I4892 Unspecified atrial flutter: Secondary | ICD-10-CM | POA: Diagnosis not present

## 2016-05-31 DIAGNOSIS — D510 Vitamin B12 deficiency anemia due to intrinsic factor deficiency: Secondary | ICD-10-CM | POA: Diagnosis not present

## 2016-06-04 DIAGNOSIS — G9340 Encephalopathy, unspecified: Secondary | ICD-10-CM | POA: Diagnosis not present

## 2016-06-04 DIAGNOSIS — Z7982 Long term (current) use of aspirin: Secondary | ICD-10-CM | POA: Diagnosis not present

## 2016-06-04 DIAGNOSIS — F418 Other specified anxiety disorders: Secondary | ICD-10-CM | POA: Diagnosis not present

## 2016-06-04 DIAGNOSIS — Z86711 Personal history of pulmonary embolism: Secondary | ICD-10-CM | POA: Diagnosis not present

## 2016-06-04 DIAGNOSIS — Z9889 Other specified postprocedural states: Secondary | ICD-10-CM | POA: Diagnosis not present

## 2016-06-04 DIAGNOSIS — R079 Chest pain, unspecified: Secondary | ICD-10-CM | POA: Diagnosis not present

## 2016-06-04 DIAGNOSIS — T460X1A Poisoning by cardiac-stimulant glycosides and drugs of similar action, accidental (unintentional), initial encounter: Secondary | ICD-10-CM | POA: Diagnosis not present

## 2016-06-04 DIAGNOSIS — Z7901 Long term (current) use of anticoagulants: Secondary | ICD-10-CM | POA: Diagnosis not present

## 2016-06-04 DIAGNOSIS — T460X5A Adverse effect of cardiac-stimulant glycosides and drugs of similar action, initial encounter: Secondary | ICD-10-CM | POA: Diagnosis not present

## 2016-06-04 DIAGNOSIS — R4182 Altered mental status, unspecified: Secondary | ICD-10-CM | POA: Diagnosis not present

## 2016-06-04 DIAGNOSIS — Z79899 Other long term (current) drug therapy: Secondary | ICD-10-CM | POA: Diagnosis not present

## 2016-06-04 DIAGNOSIS — C8590 Non-Hodgkin lymphoma, unspecified, unspecified site: Secondary | ICD-10-CM | POA: Diagnosis not present

## 2016-06-04 DIAGNOSIS — F4489 Other dissociative and conversion disorders: Secondary | ICD-10-CM | POA: Diagnosis not present

## 2016-06-04 DIAGNOSIS — I6789 Other cerebrovascular disease: Secondary | ICD-10-CM | POA: Diagnosis not present

## 2016-06-04 DIAGNOSIS — R0989 Other specified symptoms and signs involving the circulatory and respiratory systems: Secondary | ICD-10-CM | POA: Diagnosis not present

## 2016-06-04 DIAGNOSIS — R45851 Suicidal ideations: Secondary | ICD-10-CM | POA: Diagnosis not present

## 2016-06-04 DIAGNOSIS — I482 Chronic atrial fibrillation: Secondary | ICD-10-CM | POA: Diagnosis not present

## 2016-06-05 DIAGNOSIS — I517 Cardiomegaly: Secondary | ICD-10-CM | POA: Diagnosis not present

## 2016-06-05 DIAGNOSIS — I4891 Unspecified atrial fibrillation: Secondary | ICD-10-CM | POA: Diagnosis not present

## 2016-06-05 DIAGNOSIS — I340 Nonrheumatic mitral (valve) insufficiency: Secondary | ICD-10-CM | POA: Diagnosis not present

## 2016-06-05 DIAGNOSIS — C8590 Non-Hodgkin lymphoma, unspecified, unspecified site: Secondary | ICD-10-CM | POA: Diagnosis not present

## 2016-06-05 DIAGNOSIS — G9340 Encephalopathy, unspecified: Secondary | ICD-10-CM | POA: Diagnosis not present

## 2016-06-05 DIAGNOSIS — R4182 Altered mental status, unspecified: Secondary | ICD-10-CM | POA: Diagnosis not present

## 2016-06-05 DIAGNOSIS — Z8249 Family history of ischemic heart disease and other diseases of the circulatory system: Secondary | ICD-10-CM | POA: Diagnosis not present

## 2016-06-05 DIAGNOSIS — Z9889 Other specified postprocedural states: Secondary | ICD-10-CM | POA: Diagnosis not present

## 2016-06-05 DIAGNOSIS — I482 Chronic atrial fibrillation: Secondary | ICD-10-CM | POA: Diagnosis not present

## 2016-06-05 DIAGNOSIS — Z7901 Long term (current) use of anticoagulants: Secondary | ICD-10-CM | POA: Diagnosis not present

## 2016-06-05 DIAGNOSIS — G4751 Confusional arousals: Secondary | ICD-10-CM | POA: Diagnosis not present

## 2016-06-05 DIAGNOSIS — I480 Paroxysmal atrial fibrillation: Secondary | ICD-10-CM | POA: Diagnosis not present

## 2016-06-05 DIAGNOSIS — F418 Other specified anxiety disorders: Secondary | ICD-10-CM | POA: Diagnosis not present

## 2016-06-06 DIAGNOSIS — R4182 Altered mental status, unspecified: Secondary | ICD-10-CM | POA: Diagnosis not present

## 2016-06-06 DIAGNOSIS — G9340 Encephalopathy, unspecified: Secondary | ICD-10-CM | POA: Diagnosis not present

## 2016-06-07 DIAGNOSIS — F331 Major depressive disorder, recurrent, moderate: Secondary | ICD-10-CM | POA: Diagnosis not present

## 2016-06-07 DIAGNOSIS — F411 Generalized anxiety disorder: Secondary | ICD-10-CM | POA: Diagnosis not present

## 2016-06-13 DIAGNOSIS — G4733 Obstructive sleep apnea (adult) (pediatric): Secondary | ICD-10-CM | POA: Diagnosis not present

## 2016-06-13 DIAGNOSIS — I4892 Unspecified atrial flutter: Secondary | ICD-10-CM | POA: Diagnosis not present

## 2016-06-13 DIAGNOSIS — E7800 Pure hypercholesterolemia, unspecified: Secondary | ICD-10-CM | POA: Diagnosis not present

## 2016-06-14 DIAGNOSIS — G4733 Obstructive sleep apnea (adult) (pediatric): Secondary | ICD-10-CM | POA: Diagnosis not present

## 2016-06-15 ENCOUNTER — Ambulatory Visit
Admission: RE | Admit: 2016-06-15 | Discharge: 2016-06-15 | Disposition: A | Discharge status: Home / Self Care | Payer: PPO | Source: Ambulatory Visit | Attending: Cardiothoracic Surgery | Admitting: Cardiothoracic Surgery

## 2016-06-15 ENCOUNTER — Encounter: Payer: Self-pay | Admitting: Cardiothoracic Surgery

## 2016-06-15 ENCOUNTER — Ambulatory Visit (INDEPENDENT_AMBULATORY_CARE_PROVIDER_SITE_OTHER): Payer: PPO | Admitting: Cardiothoracic Surgery

## 2016-06-15 VITALS — BP 119/73 | HR 92 | Resp 20 | Ht 74.0 in | Wt 282.0 lb

## 2016-06-15 DIAGNOSIS — R918 Other nonspecific abnormal finding of lung field: Secondary | ICD-10-CM

## 2016-06-15 DIAGNOSIS — R911 Solitary pulmonary nodule: Secondary | ICD-10-CM

## 2016-06-15 MED ORDER — IOPAMIDOL (ISOVUE-300) INJECTION 61%
75.0000 mL | Freq: Once | INTRAVENOUS | Status: AC | PRN
  Administered 2016-06-15: 75 mL via INTRAVENOUS

## 2016-06-15 NOTE — Progress Notes (Signed)
[address]       [place] [address]             [phone]                    [name] [facility] Record #[id number] Date of Birth: [birth year]  Referring: Nicholes Mango, MD Primary Care: [facility] Angelique Blonder., MD  Chief Complaint:    Chief Complaint  Patient presents with  . Follow-up    3 month f/u with Chest CT, eval hilar mass    History of Present Illness:    Edward Johnston 66 y.o. male is seen in the office  today after   bronchoscopy and ebus with transbronchial biopsy of mediastinal nodes Done August 2017. A follow-up CT scan was done 12/16/2015.  The final path  Diagnosis FINE NEEDLE ASPIRATION, ENDOSCOPIC EBUS, 10L (SPECIMEN 3 OF 4 COLLECTED 11-16-2015) NO MALIGNANT CELLS IDENTIFIED.   Patient was diagnosed with a low-grade lymphoma from a right groin biopsy. He presented to [facility] in March 2017 with saddle pulmonary embolus confirmed on CT scan . A second CT scan of the chest was done in May 2017 when he presented for routine visit to primary care was noted to be tachycardic, on this follow-up scan is noted to have a left hilar mass since we underwent bronchoscopy done by Dr Glade Lloyd in [facility]. No endobronchial lesions were noted, the patient's Eliquist  was resumed.. On 6/ 28 he had a PET scan which showed left hypermetabolic suprahilar mass suggestive of primary carcinoma the lung and ipsilateral nodal metastasis to the left lower paratracheal nodes is also noted to have bilateral mildly hypermetabolic nodes on July 3 needle biopsy was performed a right inguinal node revealing follicular lymphoma low-grade.    The patient's overall activity level is extremely low spending more than 75% of his time sitting in the chair at home. He notes he gets short of breath with minimal activity such as just walking around in the yard. , He's been seen by cardiology when he had presented several months ago with rapid atrial fibrillation.    Several weeks ago the patient was hospitalized in [place] for rhythm problems and confusion, CT of the brain was done at that time  Patient is a lifelong nonsmoker      Current Activity/ Functional Status:  Patient is independent with mobility/ambulation, transfers, ADL's, IADL's.   Zubrod Score: At the time of surgery this patient's most appropriate activity status/level should be described as: '[]'$     0    Normal activity, no symptoms '[]'$     1    Restricted in physical strenuous activity but ambulatory, able to do out light work '[]'$     2    Ambulatory and capable of self care, unable to do work activities, up and about               >50 % of waking hours                              '[x]'$     3    Only limited self care, in bed greater than 50% of waking hours '[]'$     4    Completely disabled, no self care, confined to bed or chair '[]'$     5    Moribund   Past Medical History:  Diagnosis Date  . Anxiety   .  Complication of anesthesia   . Depression   . Dysrhythmia   . Hypertension    due to psych med  . Incontinence   . Persistent atrial fibrillation ([place])   . PONV (postoperative nausea and vomiting)   . Shortness of breath dyspnea     Past Surgical History:  Procedure Laterality Date  .  aneurysm clip    . BRAIN SURGERY  1989   aneurysm clipping- [facility]  . JOINT REPLACEMENT    . LYMPH NODE BIOPSY N/A 11/16/2015   Procedure: Biopsy of node 7,4L and 10L;  Surgeon: Grace Isaac, MD;  Location: [facility];  Service: Thoracic;  Laterality: N/A;  . PROSTATECTOMY    . REPLACEMENT TOTAL KNEE    . VIDEO BRONCHOSCOPY WITH ENDOBRONCHIAL ULTRASOUND N/A 11/16/2015   Procedure: VIDEO BRONCHOSCOPY WITH ENDOBRONCHIAL ULTRASOUND;  Surgeon: Grace Isaac, MD;  Location: [facility];  Service: Thoracic;  Laterality: N/A;    Family History  Problem Relation Age of Onset  . Depression Sister     Social History   Social History  . Marital status: Married    Spouse name: N/A  . Number of  children: N/A  . Years of education: N/A   Occupational History  . Retired previously worked on gradual    Social History Main Topics  . Smoking status: Never Smoker  . Smokeless tobacco: Not on file  . Alcohol use No  . Drug use: No  . Sexual activity: Not Currently      History  Smoking Status  . Never Smoker  Smokeless Tobacco  . Never Used    History  Alcohol Use No     Allergies  Allergen Reactions  . No Known Allergies     Current Outpatient Prescriptions  Medication Sig Dispense Refill  . apixaban (ELIQUIS) 5 MG TABS tablet Take 5 mg by mouth 2 (two) times daily.    . ARIPiprazole (ABILIFY) 2 MG tablet Take 1 mg by mouth at bedtime.     Marland Kitchen aspirin EC 81 MG tablet Take 81 mg by mouth daily.    Marland Kitchen atenolol (TENORMIN) 25 MG tablet Take 25 mg by mouth 2 (two) times daily.     . benztropine (COGENTIN) 1 MG tablet Take 0.5 mg by mouth at bedtime.     Marland Kitchen buPROPion (WELLBUTRIN XL) 150 MG 24 hr tablet Take 300 mg by mouth daily.     . clorazepate (TRANXENE) 7.5 MG tablet Take 3.75 mg by mouth 2 (two) times daily as needed for anxiety.     Marland Kitchen latanoprost (XALATAN) 0.005 % ophthalmic solution Place 1 drop into the left eye at bedtime.    Marland Kitchen omeprazole (PRILOSEC) 20 MG capsule Take 20 mg by mouth daily.    . Vilazodone HCl (VIIBRYD) 40 MG TABS Take 20 mg by mouth daily.      No current facility-administered medications for this visit.       Review of Systems:     Cardiac Review of Systems: Y or N  Chest Pain Florencio.Farrier    ]  Resting SOB [  n ] Exertional SOB  Blue.Reese  ]  Orthopnea Blue.Reese  ]   Pedal Edema Florencio.Farrier   ]    Palpitations Florencio.Farrier  ] Syncope  [ n ]   Presyncope [ n  ]  General Review of Systems: [Y] = yes [  ]=no Constitional: recent weight change [ n ];  Wt loss over the last 3 months [   ]  anorexia [  ]; fatigue [  ]; nausea [  ]; night sweats [  ]; fever [  ]; or chills [  ];          Dental: poor dentition[  ]; Last Dentist visit:   Eye : blurred vision [  ]; diplopia [   ]; vision  changes [  ];  Amaurosis fugax[  ]; Resp: cough [  ];  wheezing[  ];  hemoptysis[  ]; shortness of breath[  ]; paroxysmal nocturnal dyspnea[  ]; dyspnea on exertion[  ]; or orthopnea[  ];  GI:  gallstones[  ], vomiting[  ];  dysphagia[  ]; melena[  ];  hematochezia [  ]; heartburn[  ];   Hx of  Colonoscopy[  ]; GU: kidney stones [  ]; hematuria[  ];   dysuria [  ];  nocturia[  ];  history of     obstruction [  ]; urinary frequency [  ]             Skin: rash, swelling[  ];, hair loss[  ];  peripheral edema[  ];  or itching[  ]; Musculosketetal: myalgias[  ];  joint swelling[  ];  joint erythema[  ];  joint pain[  ];  back pain[  ];  Heme/Lymph: bruising[y  ];  bleeding[  ];  anemia[  ];  Neuro: TIA[  ];  headaches[  ];  stroke[  ];  vertigo[  ];  seizures[  ];   paresthesias[  ];  difficulty walking[  ];  Psych:depression[ y ]; anxiety[  ];  Endocrine: diabetes[  ];  thyroid dysfunction[  ];  Immunizations: Flu up to date [  ?]; Pneumococcal up to date [ > ];  Other:  Physical Exam: BP 119/73   Pulse 92   Resp 20   Ht '6\' 2"'$  (1.88 m)   Wt 282 lb (127.9 kg)   SpO2 99% Comment: RA  BMI 36.21 kg/m   PHYSICAL EXAMINATION: General appearance: slowed mentation Head: Normocephalic, without obvious abnormality, atraumatic Neck: no adenopathy, no carotid bruit, no JVD, supple, symmetrical, trachea midline and thyroid not enlarged, symmetric, no tenderness/mass/nodules Lymph nodes: Cervical, supraclavicular, and axillary nodes normal. and left goin node biopsyed via needle dbx Resp: diminished breath sounds bibasilar Back: symmetric, no curvature. ROM normal. No CVA tenderness. Cardio: regular rate and rhythm, S1, S2 normal, no murmur, click, rub or gallop GI: soft, non-tender; bowel sounds normal; no masses,  no organomegaly Extremities: extremities normal, atraumatic, no cyanosis or edema and Homans sign is negative, no sign of DVT Neurologic: Mental status: alertness: lethargic, affect:  flat  Diagnostic Studies & Laboratory data:     Recent Radiology Findings:  Ct Chest W Contrast  Result Date: 06/15/2016 CLINICAL DATA:  Follow-up left suprahilar pulmonary nodule, which was hypermetabolic on prior PET-CT. Tissue sampling on 11/16/2015 bronchoscopy was negative for malignancy. Patient reportedly diagnosed with low-grade follicular lymphoma on biopsy of a hypermetabolic inguinal lymph node detected on prior PET-CT. EXAM: CT CHEST WITH CONTRAST TECHNIQUE: Multidetector CT imaging of the chest was performed during intravenous contrast administration. Creatinine was obtained on site at [place] at [address] Results: Creatinine 1.1 mg/dL. CONTRAST:  13m ISOVUE-300 IOPAMIDOL (ISOVUE-300) INJECTION 61% COMPARISON:  03/16/2016 chest CT. FINDINGS: Cardiovascular: Stable top-normal heart size . No significant pericardial fluid/thickening. Atherosclerotic nonaneurysmal thoracic aorta. Normal caliber pulmonary arteries. No central pulmonary emboli. Mediastinum/Nodes: Stable substernal extension of inferior right thyroid lobe 3.5 cm nodule. Unremarkable esophagus.  Stable mild bilateral axillary lymphadenopathy measuring up to the 1.2 cm on the right (series 3/ image 56) and 1.3 cm on the left (series 3/image 10). Enlarged 1.4 cm left paratracheal lymph node (series 3/ image 38), previously 1.2 cm, mildly increased. No additional pathologically enlarged mediastinal or hilar lymph nodes. Lungs/Pleura: No pneumothorax. New trace dependent left pleural effusion. No right pleural effusion. Central left upper lobe 2.8 x 2.4 cm pulmonary nodule (series 4/ image 38), previously 2.6 x 2.2 cm on 03/16/2016 using similar measurement technique and 2.4 x 1.8 cm on 09/01/2015, mildly increased in size. Mosaic attenuation in the left upper lobe suggests mild air trapping. No acute consolidative airspace disease or additional significant pulmonary nodules. Upper abdomen: Diffuse hepatic steatosis.  Musculoskeletal: No aggressive appearing focal osseous lesions. Moderate thoracic spondylosis. IMPRESSION: 1. Mild interval growth of central left upper lobe 2.8 cm pulmonary nodule, which was hypermetabolic on 84/69/6295 PET-CT and remains suspicious for a primary bronchogenic carcinoma. 2. Mild interval growth of mild left paratracheal lymphadenopathy, which was hypermetabolic on 28/41/3244 PET-CT and remain suspicious for ipsilateral mediastinal nodal metastasis. 3. No new sites of metastatic disease in the chest. 4. Stable mild bilateral axillary lymphadenopathy, most likely due to the patient's known low grade follicular lymphoma. 5. New trace dependent left pleural effusion. 6. Stable substernal extension of 3.5 cm lower right thyroid lobe nodule. 7. Aortic atherosclerosis. 8. Diffuse hepatic steatosis. Electronically Signed   By: Ilona Sorrel M.D.   On: 06/15/2016 13:30   Ct Super D Chest Wo Contrast  Result Date: 03/16/2016 CLINICAL DATA:  Followup left hilar mass EXAM: CT CHEST WITHOUT CONTRAST TECHNIQUE: Multidetector CT imaging of the chest was performed using thin slice collimation for electromagnetic bronchoscopy planning purposes, without intravenous contrast. COMPARISON:  12/16/2015 FINDINGS: Cardiovascular: The heart size appears normal. Aortic atherosclerosis noted. Calcification in the LAD coronary artery noted. Left circumflex coronary artery calcification also noted. Mediastinum/Nodes: No pleural fluid. The trachea appears patent and is midline. Normal appearance of the esophagus. Nodule arising from the right lobe of thyroid gland is again noted measuring 3.4 cm, image 21 of series 3. The previous pre-vascular lymph node now measures 8 mm, image 45 of series 3. Previously 1 cm. The previous left hilar mass is difficult to re-evaluate due to lack of IV contrast material. Using the same measurement technique from 12/06/2015 the area of concern measures 1.9 cm, image 41 of series 3. Previously  2 cm. Lungs/Pleura: There is no pleural fluid identified. No atelectasis or pneumonitis. No airspace consolidation. Upper Abdomen: Normal appearance of the adrenal glands. Nonspecific soft tissue stranding within the mesenteric fat is identified. No acute abnormality identified. Musculoskeletal: There is degenerative disc disease identified within the thoracic spine. No aggressive lytic or sclerotic bone lesions identified. IMPRESSION: 1. Further regression of previously enlarged prevascular lymph node which now measures 8 mm. 2. Difficult to re-evaluate due to lack of IV contrast material, the previously referenced left hilar soft tissue appears stable to decreased in the interval. Electronically Signed   By: Kerby Moors M.D.   On: 03/16/2016 14:01     Ct Super D Chest Wo Contrast  Result Date: 12/16/2015 CLINICAL DATA:  Hilar mass.  Hypertension.  History of aneurysm. EXAM: CT CHEST WITHOUT CONTRAST TECHNIQUE: Multidetector CT imaging of the chest was performed using thin slice collimation for electromagnetic bronchoscopy planning purposes, without intravenous contrast. COMPARISON:  Multiple exams, including 09/29/2015 FINDINGS: Cardiovascular: Atherosclerotic calcification of the aortic arch. Mild cardiomegaly. Mediastinum/Nodes: Mildly hypodense 3.7  by 2.5 cm right thyroid nodule extends in the upper mediastinum, metabolic activity similar to the rest of the thyroid gland on recent PET-CT. Left suprahilar mass measuring about 2.0 cm in short axis diameter, previously 2.7 cm on 09/01/2015. Partially obscured by surrounding vasculature. Adjacent AP window lymph node 1.0 cm in short axis on image 40/3, previously 1.1 cm. Left lower paratracheal node 1.3 cm in short axis on image 39/3, previously 1.5 cm. The hilar lymph nodes shown previously are less well seen due to the lack of IV contrast. Upper normal size bilateral axillary lymph nodes. Lungs/Pleura: Despite efforts by the technologist and patient,  motion artifact is present on today's exam and could not be eliminated. This reduces exam sensitivity and specificity. Left suprahilar mass is noted above. No new pulmonary nodule identified. Upper Abdomen: Unremarkable Musculoskeletal: Thoracic spondylosis with multilevel spurring. IMPRESSION: 1. The left suprahilar mass and adjacent lymph nodes appear smaller than on the prior exam, but still abnormally enlarged. 2. Hypodense right inferior thyroid nodule, similar to prior exams. Electronically Signed   By: Van Clines M.D.   On: 12/16/2015 14:53    CLINICAL DATA:Initial treatment strategy for mediastinal lymphadenopathy.  EXAM: NUCLEAR MEDICINE PET SKULL BASE TO THIGH  TECHNIQUE: 43 MCi F-18 FDG was injected intravenously. Full-ring PET imaging was performed from the skull base to thigh after the radiotracer. CT data was obtained and used for attenuation correction and anatomic localization.  FASTING BLOOD GLUCOSE:Value: 114 mg/dl  COMPARISON:CT chest 09/01/2015  FINDINGS: NECK  There is bilateral hypermetabolic activity with the thyroid gland. There is nodular enlargement of the RIGHT lobe of thyroid gland to 2.8 by 3.4 cm. This nodule enlargement extends from the lower pole the RIGHT gland into the RIGHT upper paratracheal mediastinum (image 96 series 3). Diffuse metabolic activity favors thyroiditis with goiter.  CHEST  Hypermetabolic soft tissue thickening in the LEFT suprahilar lung / mediastinum measures 2.6 cm (image 117, series 3) with intense metabolic activity (insert SUV max 8.9).  There is a hypermetabolic LEFT lower paratracheal lymph node measures 13 mm with SUV max equal 5.9. No contralateral hypermetabolic lymph nodes. No supraclavicular lymph nodes.  Bilateral mild metabolic activity associated with axial lymph nodes. These lymph nodes have normal morphology. For example peripheral lymph node in the RIGHT axilla with SUV max equal  1.9.  ABDOMEN/PELVIS  No abnormal hypermetabolic activity within the liver, pancreas, adrenal glands, or spleen.  Hypermetabolic lymph node in the medial LEFT inguinal region with measuring 21 mm short axis node on image 338, series 3 with SUV max equal 7.0.  Within the central mesentery, prominent lymph nodes surrounded by slight haziness to the mesentery (image 221, series 3). Mild metabolic activity associated with these prominent lymph nodes (SUV max of 3.2).  Spleen is normal.  SKELETON  No focal hypermetabolic activity to suggest skeletal metastasis.  IMPRESSION: 1. Hypermetabolic LEFT suprahilar mass consists primary bronchogenic carcinoma. 2. Ipsilateral nodal metastasis to the LEFT lower paratracheal nodal station. 3. No evidence of distant metastatic consistent bronchogenic carcinoma. 4. Bilateral mildly hypermetabolic axillary lymph nodes. Prominent central mesenteric adenopathy with hazy mesentery pattern. Intensely hypermetabolic LEFT inguinal lymph node. Overall the findings raise the concern for low-grade lymphoproliferative process. Recommend biopsy of the most hypermetabolic lesion (medial LEFT inguinal lymph node).   Electronically Signed ByOswald Hillock M.D. On: 09/29/2015 14:05 I have independently reviewed the above radiologic studies.  Recent Lab Findings: Lab Results  Component Value Date   WBC 10.2 11/15/2015   HGB 16.6  11/15/2015   HCT 49.6 11/15/2015   PLT 181 11/15/2015   GLUCOSE 137 (H) 11/15/2015   ALT 50 11/15/2015   AST 29 11/15/2015   NA 138 11/15/2015   K 4.3 11/15/2015   CL 108 11/15/2015   CREATININE 1.19 11/15/2015   BUN 15 11/15/2015   CO2 23 11/15/2015   INR 1.10 11/15/2015      Assessment / Plan:   [place]addle pulmonary embolus March 2017 Relatively new onset of atrial fibrillation, appears intermittent Lifelong nonsmoker Diagnosis of follicular cell low-grade lymphoma  By needle biopsy of left groin Left  hilar mass suggestive of non-small cell lung cancer with ipsilateral nodal metastasis by PET scan- workup so far reveals negative bronchoscopy and EBUS- Follow-up CT scan done today, with very slight enlargement. I discussed with the patient and his wife how to proceed at this point, he has biopsy-proven low-grade follicular cell lymphoma . In spite of 2 separate attempts at obtaining a tissue diagnosis from the left hilum we still have no definitive tissue diagnosis. The patient is not an operative candidate for any surgical resection. I suggested to the patient and his wife we could repeat attempts at bronchoscopy and ebus. With his underlying functional status he would prefer not to proceed with this currently we will obtain a follow-up CT scan in 3 months. I will review the case with radiation oncology but do not think that they would be willing to treat without a tissue diagnosis.  Grace Isaac MD      [address] [place] [address] Office [phone]   Beeper [phone]  06/15/2016 2:00 PM

## 2016-06-20 DIAGNOSIS — C8305 Small cell B-cell lymphoma, lymph nodes of inguinal region and lower limb: Secondary | ICD-10-CM | POA: Diagnosis not present

## 2016-06-20 DIAGNOSIS — C61 Malignant neoplasm of prostate: Secondary | ICD-10-CM | POA: Diagnosis not present

## 2016-06-20 DIAGNOSIS — Z7982 Long term (current) use of aspirin: Secondary | ICD-10-CM | POA: Diagnosis not present

## 2016-06-20 DIAGNOSIS — F329 Major depressive disorder, single episode, unspecified: Secondary | ICD-10-CM | POA: Diagnosis not present

## 2016-06-20 DIAGNOSIS — F419 Anxiety disorder, unspecified: Secondary | ICD-10-CM | POA: Diagnosis not present

## 2016-06-20 DIAGNOSIS — I4892 Unspecified atrial flutter: Secondary | ICD-10-CM | POA: Diagnosis not present

## 2016-06-20 DIAGNOSIS — R590 Localized enlarged lymph nodes: Secondary | ICD-10-CM | POA: Diagnosis not present

## 2016-06-20 DIAGNOSIS — G4730 Sleep apnea, unspecified: Secondary | ICD-10-CM | POA: Diagnosis not present

## 2016-06-20 DIAGNOSIS — I4891 Unspecified atrial fibrillation: Secondary | ICD-10-CM | POA: Diagnosis not present

## 2016-06-20 DIAGNOSIS — N529 Male erectile dysfunction, unspecified: Secondary | ICD-10-CM | POA: Diagnosis not present

## 2016-06-20 DIAGNOSIS — R531 Weakness: Secondary | ICD-10-CM | POA: Diagnosis not present

## 2016-06-20 DIAGNOSIS — R319 Hematuria, unspecified: Secondary | ICD-10-CM | POA: Diagnosis not present

## 2016-06-20 DIAGNOSIS — Z79899 Other long term (current) drug therapy: Secondary | ICD-10-CM | POA: Diagnosis not present

## 2016-06-20 DIAGNOSIS — Z86711 Personal history of pulmonary embolism: Secondary | ICD-10-CM | POA: Diagnosis not present

## 2016-06-20 DIAGNOSIS — I700 Atherosclerosis of aorta: Secondary | ICD-10-CM | POA: Diagnosis not present

## 2016-06-20 DIAGNOSIS — F418 Other specified anxiety disorders: Secondary | ICD-10-CM | POA: Diagnosis not present

## 2016-06-20 DIAGNOSIS — N138 Other obstructive and reflux uropathy: Secondary | ICD-10-CM | POA: Diagnosis not present

## 2016-06-20 DIAGNOSIS — K760 Fatty (change of) liver, not elsewhere classified: Secondary | ICD-10-CM | POA: Diagnosis not present

## 2016-06-20 DIAGNOSIS — N393 Stress incontinence (female) (male): Secondary | ICD-10-CM | POA: Diagnosis not present

## 2016-06-20 DIAGNOSIS — Z803 Family history of malignant neoplasm of breast: Secondary | ICD-10-CM | POA: Diagnosis not present

## 2016-06-20 DIAGNOSIS — E041 Nontoxic single thyroid nodule: Secondary | ICD-10-CM | POA: Diagnosis not present

## 2016-06-20 DIAGNOSIS — J90 Pleural effusion, not elsewhere classified: Secondary | ICD-10-CM | POA: Diagnosis not present

## 2016-06-20 DIAGNOSIS — R918 Other nonspecific abnormal finding of lung field: Secondary | ICD-10-CM | POA: Diagnosis not present

## 2016-06-20 DIAGNOSIS — N401 Enlarged prostate with lower urinary tract symptoms: Secondary | ICD-10-CM | POA: Diagnosis not present

## 2016-07-04 DIAGNOSIS — C8305 Small cell B-cell lymphoma, lymph nodes of inguinal region and lower limb: Secondary | ICD-10-CM | POA: Diagnosis not present

## 2016-07-04 DIAGNOSIS — I4892 Unspecified atrial flutter: Secondary | ICD-10-CM | POA: Diagnosis not present

## 2016-07-05 DIAGNOSIS — R5383 Other fatigue: Secondary | ICD-10-CM | POA: Diagnosis not present

## 2016-07-05 DIAGNOSIS — G4733 Obstructive sleep apnea (adult) (pediatric): Secondary | ICD-10-CM | POA: Diagnosis not present

## 2016-07-05 DIAGNOSIS — R0600 Dyspnea, unspecified: Secondary | ICD-10-CM | POA: Diagnosis not present

## 2016-07-05 DIAGNOSIS — R918 Other nonspecific abnormal finding of lung field: Secondary | ICD-10-CM | POA: Diagnosis not present

## 2016-07-06 DIAGNOSIS — D510 Vitamin B12 deficiency anemia due to intrinsic factor deficiency: Secondary | ICD-10-CM | POA: Diagnosis not present

## 2016-07-14 DIAGNOSIS — Z0000 Encounter for general adult medical examination without abnormal findings: Secondary | ICD-10-CM | POA: Diagnosis not present

## 2016-07-14 DIAGNOSIS — C8280 Other types of follicular lymphoma, unspecified site: Secondary | ICD-10-CM | POA: Diagnosis not present

## 2016-07-14 DIAGNOSIS — I4892 Unspecified atrial flutter: Secondary | ICD-10-CM | POA: Diagnosis not present

## 2016-07-14 DIAGNOSIS — E041 Nontoxic single thyroid nodule: Secondary | ICD-10-CM | POA: Diagnosis not present

## 2016-07-14 DIAGNOSIS — Z9181 History of falling: Secondary | ICD-10-CM | POA: Diagnosis not present

## 2016-07-14 DIAGNOSIS — G4733 Obstructive sleep apnea (adult) (pediatric): Secondary | ICD-10-CM | POA: Diagnosis not present

## 2016-07-14 DIAGNOSIS — Z6836 Body mass index (BMI) 36.0-36.9, adult: Secondary | ICD-10-CM | POA: Diagnosis not present

## 2016-07-15 DIAGNOSIS — G4733 Obstructive sleep apnea (adult) (pediatric): Secondary | ICD-10-CM | POA: Diagnosis not present

## 2016-07-19 DIAGNOSIS — Z23 Encounter for immunization: Secondary | ICD-10-CM | POA: Diagnosis not present

## 2016-07-20 DIAGNOSIS — E041 Nontoxic single thyroid nodule: Secondary | ICD-10-CM | POA: Diagnosis not present

## 2016-07-27 DIAGNOSIS — C8305 Small cell B-cell lymphoma, lymph nodes of inguinal region and lower limb: Secondary | ICD-10-CM | POA: Diagnosis not present

## 2016-07-27 DIAGNOSIS — I4892 Unspecified atrial flutter: Secondary | ICD-10-CM | POA: Diagnosis not present

## 2016-07-31 DIAGNOSIS — E041 Nontoxic single thyroid nodule: Secondary | ICD-10-CM | POA: Diagnosis not present

## 2016-08-04 DIAGNOSIS — R0600 Dyspnea, unspecified: Secondary | ICD-10-CM | POA: Diagnosis not present

## 2016-08-04 DIAGNOSIS — R5383 Other fatigue: Secondary | ICD-10-CM | POA: Diagnosis not present

## 2016-08-04 DIAGNOSIS — G4733 Obstructive sleep apnea (adult) (pediatric): Secondary | ICD-10-CM | POA: Diagnosis not present

## 2016-08-04 DIAGNOSIS — R918 Other nonspecific abnormal finding of lung field: Secondary | ICD-10-CM | POA: Diagnosis not present

## 2016-08-04 DIAGNOSIS — D510 Vitamin B12 deficiency anemia due to intrinsic factor deficiency: Secondary | ICD-10-CM | POA: Diagnosis not present

## 2016-08-08 DIAGNOSIS — F411 Generalized anxiety disorder: Secondary | ICD-10-CM | POA: Diagnosis not present

## 2016-08-08 DIAGNOSIS — G4730 Sleep apnea, unspecified: Secondary | ICD-10-CM | POA: Diagnosis not present

## 2016-08-08 DIAGNOSIS — F330 Major depressive disorder, recurrent, mild: Secondary | ICD-10-CM | POA: Diagnosis not present

## 2016-08-14 DIAGNOSIS — G4733 Obstructive sleep apnea (adult) (pediatric): Secondary | ICD-10-CM | POA: Diagnosis not present

## 2016-08-23 ENCOUNTER — Other Ambulatory Visit: Payer: Self-pay | Admitting: Cardiothoracic Surgery

## 2016-08-23 DIAGNOSIS — R918 Other nonspecific abnormal finding of lung field: Secondary | ICD-10-CM

## 2016-09-01 DIAGNOSIS — D510 Vitamin B12 deficiency anemia due to intrinsic factor deficiency: Secondary | ICD-10-CM | POA: Diagnosis not present

## 2016-09-11 DIAGNOSIS — R238 Other skin changes: Secondary | ICD-10-CM | POA: Diagnosis not present

## 2016-09-11 DIAGNOSIS — Z23 Encounter for immunization: Secondary | ICD-10-CM | POA: Diagnosis not present

## 2016-09-11 DIAGNOSIS — Z0000 Encounter for general adult medical examination without abnormal findings: Secondary | ICD-10-CM | POA: Diagnosis not present

## 2016-09-11 DIAGNOSIS — Z1389 Encounter for screening for other disorder: Secondary | ICD-10-CM | POA: Diagnosis not present

## 2016-09-13 DIAGNOSIS — G4733 Obstructive sleep apnea (adult) (pediatric): Secondary | ICD-10-CM | POA: Diagnosis not present

## 2016-09-14 DIAGNOSIS — G4733 Obstructive sleep apnea (adult) (pediatric): Secondary | ICD-10-CM | POA: Diagnosis not present

## 2016-09-27 DIAGNOSIS — C61 Malignant neoplasm of prostate: Secondary | ICD-10-CM | POA: Diagnosis not present

## 2016-09-28 ENCOUNTER — Ambulatory Visit (INDEPENDENT_AMBULATORY_CARE_PROVIDER_SITE_OTHER): Payer: PPO | Admitting: Cardiothoracic Surgery

## 2016-09-28 ENCOUNTER — Ambulatory Visit
Admission: RE | Admit: 2016-09-28 | Discharge: 2016-09-28 | Disposition: A | Discharge status: Home / Self Care | Payer: PPO | Source: Ambulatory Visit | Attending: Cardiothoracic Surgery | Admitting: Cardiothoracic Surgery

## 2016-09-28 VITALS — BP 123/85 | HR 52 | Resp 20 | Ht 74.0 in | Wt 266.0 lb

## 2016-09-28 DIAGNOSIS — R590 Localized enlarged lymph nodes: Secondary | ICD-10-CM | POA: Diagnosis not present

## 2016-09-28 DIAGNOSIS — R918 Other nonspecific abnormal finding of lung field: Secondary | ICD-10-CM

## 2016-09-28 DIAGNOSIS — R911 Solitary pulmonary nodule: Secondary | ICD-10-CM | POA: Diagnosis not present

## 2016-09-28 NOTE — Progress Notes (Signed)
[address]       [place] [address]             [phone]                    Edward Johnston [place] Medical Record #[id number] Date of Birth: [birth year]  Referring: Nicholes Mango, MD Primary Care: Angelina Sheriff, MD  Chief Complaint:    Chief Complaint  Patient presents with  . Lung Mass    3 month f/u with Chest CT    History of Present Illness:    Edward Johnston 66 y.o. male  is seen in the office today with a follow-up CT scan.  I Originally performed   bronchoscopy and ebus with transbronchial biopsy of mediastinal nodes Done August 2017.  The final path  - FINE NEEDLE ASPIRATION, ENDOSCOPIC EBUS, 10L (SPECIMEN 3 OF 4 COLLECTED 11-16-2015) NO MALIGNANT CELLS IDENTIFIED. Since last seen the patient has been stable, he did note a thyroid biopsy was done recently     Patient has a history of low-grade lymphoma from a right groin biopsy. He presented to [facility] in March 2017 with saddle pulmonary embolus confirmed on CT scan . A second CT scan of the chest was done in May 2017 when he presented for routine visit to primary care was noted to be tachycardic, on this follow-up scan is noted to have a left hilar mass, bronchoscopy done by Dr Glade Lloyd in [facility] at that time.. No endobronchial lesions were noted, the patient's Eliquist  was resumed.. On 6/ 28 2017  he had a PET scan which showed left hypermetabolic suprahilar mass suggestive of primary carcinoma the lung and ipsilateral nodal metastasis to the left lower paratracheal nodes is also noted to have bilateral mildly hypermetabolic nodes on July 3 needle biopsy was performed a right inguinal node revealing follicular lymphoma low-grade.     The patient's overall activity level is extremely low spending more than 75% of his time sitting in the chair at home. He notes he gets short of breath with minimal activity such as just walking around in the yard. , He's been seen by cardiology when  he had presented several months ago with rapid atrial fibrillation.   Patient is a lifelong nonsmoker   Current Activity/ Functional Status:  Patient is independent with mobility/ambulation, transfers, ADL's, IADL's.   Zubrod Score: At the time of surgery this patient's most appropriate activity status/level should be described as: []     0    Normal activity, no symptoms []     1    Restricted in physical strenuous activity but ambulatory, able to do out light work []     2    Ambulatory and capable of self care, unable to do work activities, up and about               >50 % of waking hours                              [x]     3    Only limited self care, in bed greater than 50% of waking hours []     4    Completely disabled, no self care, confined to bed or chair []     5    Moribund   Past Medical History:  Diagnosis Date  . Anxiety   . Complication of anesthesia   .  Depression   . Dysrhythmia   . Hypertension    due to psych med  . Incontinence   . Persistent atrial fibrillation ([place])   . PONV (postoperative nausea and vomiting)   . Shortness of breath dyspnea     Past Surgical History:  Procedure Laterality Date  .  aneurysm clip    . BRAIN SURGERY  1989   aneurysm clipping- [facility]  . JOINT REPLACEMENT    . LYMPH NODE BIOPSY N/A 11/16/2015   Procedure: Biopsy of node 7,4L and 10L;  Surgeon: Grace Isaac, MD;  Location: [place];  Service: Thoracic;  Laterality: N/A;  . PROSTATECTOMY    . REPLACEMENT TOTAL KNEE    . VIDEO BRONCHOSCOPY WITH ENDOBRONCHIAL ULTRASOUND N/A 11/16/2015   Procedure: VIDEO BRONCHOSCOPY WITH ENDOBRONCHIAL ULTRASOUND;  Surgeon: Grace Isaac, MD;  Location: [facility];  Service: Thoracic;  Laterality: N/A;    Family History  Problem Relation Age of Onset  . Depression Sister     Social History   Social History  . Marital status: Married    Spouse name: N/A  . Number of children: N/A  . Years of education: N/A   Occupational History  .  Retired previously worked on gradual    Social History Main Topics  . Smoking status: Never Smoker  . Smokeless tobacco: Not on file  . Alcohol use No  . Drug use: No  . Sexual activity: Not Currently      History  Smoking Status  . Never Smoker  Smokeless Tobacco  . Never Used    History  Alcohol Use No     Allergies  Allergen Reactions  . Digoxin Other (See Comments)    confusion  . No Known Allergies     Current Outpatient Prescriptions  Medication Sig Dispense Refill  . apixaban (ELIQUIS) 5 MG TABS tablet Take 5 mg by mouth 2 (two) times daily.    . ARIPiprazole (ABILIFY) 2 MG tablet Take 1 mg by mouth at bedtime.     Marland Kitchen aspirin EC 81 MG tablet Take 81 mg by mouth daily.    Marland Kitchen atenolol (TENORMIN) 25 MG tablet Take 25 mg by mouth 2 (two) times daily.     Marland Kitchen buPROPion (WELLBUTRIN XL) 150 MG 24 hr tablet Take 300 mg by mouth daily.     . clorazepate (TRANXENE) 7.5 MG tablet Take 3.75 mg by mouth 2 (two) times daily as needed for anxiety.     Marland Kitchen latanoprost (XALATAN) 0.005 % ophthalmic solution Place 1 drop into the left eye at bedtime.    Marland Kitchen omeprazole (PRILOSEC) 20 MG capsule Take 20 mg by mouth daily.    . Vilazodone HCl (VIIBRYD) 40 MG TABS Take 20 mg by mouth daily.      No current facility-administered medications for this visit.       Review of Systems:     Cardiac Review of Systems: Y or N  Chest Pain [nn   ]  Resting SOB [n ] Exertional SOB  [Y  ]  Orthopnea [Y]   Pedal Edema N   ]    Palpitations Aqua.Slicker ] Syncope N ]   Presyncope [ N  ]  General Review of Systems: [Y] = yes [  ]=no Constitional: recent weight change Aqua.Slicker ];  Wt loss over the last 3 months [   ] anorexia [  ]; fatigue [  ]; nausea [  ]; night sweats [  ]; fever [  ]; or  chills [  ];          Dental: poor dentition[  ]; Last Dentist visit:   Eye : blurred vision [  ]; diplopia [   ]; vision changes [  ];  Amaurosis fugax[  ]; Resp: cough [  ];  wheezing[  ];  hemoptysis[  ]; shortness of breath[   ]; paroxysmal nocturnal dyspnea[  ]; dyspnea on exertion[  ]; or orthopnea[  ];  GI:  gallstones[  ], vomiting[  ];  dysphagia[  ]; melena[  ];  hematochezia [  ]; heartburn[  ];   Hx of  Colonoscopy[  ]; GU: kidney stones [  ]; hematuria[  ];   dysuria [  ];  nocturia[  ];  history of     obstruction [  ]; urinary frequency [  ]             Skin: rash, swelling[  ];, hair loss[  ];  peripheral edema[  ];  or itching[  ]; Musculosketetal: myalgias[  ];  joint swelling[  ];  joint erythema[  ];  joint pain[  ];  back pain[  ];  Heme/Lymph: bruising[y  ];  bleeding[  ];  anemia[  ];  Neuro: TIA[N];  headaches[  ];  stroke[  ];  vertigo[  ];  seizures[  ];   paresthesias[  ];  difficulty walking[ Y ];  Psych:depression[ y ]; anxiety[  ];  Endocrine: diabetes[  ];  thyroid dysfunction[  ];  Immunizations: Flu up to date [  ?]; Pneumococcal up to date [ > ];  Other:  Physical Exam: BP 123/85   Pulse (!) 52   Resp 20   Ht 6\' 2"  (1.88 m)   Wt 266 lb (120.7 kg)   SpO2 99% Comment: RA  BMI 34.15 kg/m   PHYSICAL EXAMINATION: General appearance: slowed mentation Head: Normocephalic, no palpable cervical nodes Neck: no adenopathy, no carotid bruit, no JVD, supple, symmetrical, trachea midline and thyroid not enlarged, symmetric, no tenderness/mass/nodules Lymph nodes: Cervical, supraclavicular, and axillary nodes normal.  Examination of the axillary areas I do feel a right axillary node that easily moves around over the underlying ribs Resp: Basilar diminished breath sounds Back: Back without CVA tenderness Cardio: Regular rate and rhythm without murmur gallop GI: There is no organomegaly no splenomegaly Extremities: extremities normal, atraumatic, no cyanosis or edema and Homans sign is negative, no sign of DVT Neurologic: Mental status: Patient is with flat affect  Diagnostic Studies & Laboratory data:     Recent Radiology Findings:   Ct Chest Wo Contrast  Result Date:  09/28/2016 CLINICAL DATA:  66 year old male with history of pulmonary nodule. Follow-up study. Additional history of low-grade follicular lymphoma. EXAM: CT CHEST WITHOUT CONTRAST TECHNIQUE: Multidetector CT imaging of the chest was performed following the standard protocol without IV contrast. COMPARISON:  Chest CT 06/15/2016. FINDINGS: Cardiovascular: Heart size is mildly enlarged. There is no significant pericardial fluid, thickening or pericardial calcification. There is aortic atherosclerosis, as well as atherosclerosis of the great vessels of the mediastinum and the coronary arteries, including calcified atherosclerotic plaque in the left main, left anterior descending and left circumflex coronary arteries. Calcifications of the aortic valve. Mediastinum/Nodes: Previously noted mildly enlarged left paratracheal lymph node measuring 1.3 cm in short axis is similar to the prior examination. Several other borderline enlarged mediastinal lymph nodes are noted. Esophagus is unremarkable in appearance. Several enlarged right axillary lymph nodes measuring up to 1.3 cm in short  axis (axial image 42 of series 3). 3.1 x 2.2 cm nodule in the posterior aspect of the right lobe of the thyroid gland is similar to several prior examinations. Lungs/Pleura: Previously noted nodule in the medial aspect of the left upper lobe is stable in size measuring 2.8 x 2.4 cm (axial image 41 of series 4). No other new suspicious appearing pulmonary nodules or masses are noted. There is some patchy ground-glass attenuation dependently in the right lower lobe which is nonspecific and could reflect areas of mild inflammation. No confluent consolidative airspace disease. No pleural effusions. Upper Abdomen: Aortic atherosclerosis. Musculoskeletal: There are no aggressive appearing lytic or blastic lesions noted in the visualized portions of the skeleton. IMPRESSION: 1. Previously noted left upper lobe pulmonary nodule and left paratracheal  lymphadenopathy are stable compared to the prior examination. No new pulmonary nodules are noted. 2. Interval worsening of right axillary lymphadenopathy concerning for recurrent disease in this patient with history of follicular lymphoma. 3. Aortic atherosclerosis, in addition to left main and 2 vessel coronary artery disease. Please note that although the presence of coronary artery calcium documents the presence of coronary artery disease, the severity of this disease and any potential stenosis cannot be assessed on this non-gated CT examination. Assessment for potential risk factor modification, dietary therapy or pharmacologic therapy may be warranted, if clinically indicated. 4. There are calcifications of the aortic valve. Echocardiographic correlation for evaluation of potential valvular dysfunction may be warranted if clinically indicated. Aortic Atherosclerosis (ICD10-I70.0). Electronically Signed   By: Vinnie Langton M.D.   On: 09/28/2016 15:22   Ct Chest W Contrast  Result Date: 06/15/2016 CLINICAL DATA:  Follow-up left suprahilar pulmonary nodule, which was hypermetabolic on prior PET-CT. Tissue sampling on 11/16/2015 bronchoscopy was negative for malignancy. Patient reportedly diagnosed with low-grade follicular lymphoma on biopsy of a hypermetabolic inguinal lymph node detected on prior PET-CT. EXAM: CT CHEST WITH CONTRAST TECHNIQUE: Multidetector CT imaging of the chest was performed during intravenous contrast administration. Creatinine was obtained on site at [facility] at [address] Results: Creatinine 1.1 mg/dL. CONTRAST:  50mL ISOVUE-300 IOPAMIDOL (ISOVUE-300) INJECTION 61% COMPARISON:  03/16/2016 chest CT. FINDINGS: Cardiovascular: Stable top-normal heart size . No significant pericardial fluid/thickening. Atherosclerotic nonaneurysmal thoracic aorta. Normal caliber pulmonary arteries. No central pulmonary emboli. Mediastinum/Nodes: Stable substernal extension of  inferior right thyroid lobe 3.5 cm nodule. Unremarkable esophagus. Stable mild bilateral axillary lymphadenopathy measuring up to the 1.2 cm on the right (series 3/ image 56) and 1.3 cm on the left (series 3/image 10). Enlarged 1.4 cm left paratracheal lymph node (series 3/ image 38), previously 1.2 cm, mildly increased. No additional pathologically enlarged mediastinal or hilar lymph nodes. Lungs/Pleura: No pneumothorax. New trace dependent left pleural effusion. No right pleural effusion. Central left upper lobe 2.8 x 2.4 cm pulmonary nodule (series 4/ image 38), previously 2.6 x 2.2 cm on 03/16/2016 using similar measurement technique and 2.4 x 1.8 cm on 09/01/2015, mildly increased in size. Mosaic attenuation in the left upper lobe suggests mild air trapping. No acute consolidative airspace disease or additional significant pulmonary nodules. Upper abdomen: Diffuse hepatic steatosis. Musculoskeletal: No aggressive appearing focal osseous lesions. Moderate thoracic spondylosis. IMPRESSION: 1. Mild interval growth of central left upper lobe 2.8 cm pulmonary nodule, which was hypermetabolic on 16/60/6301 PET-CT and remains suspicious for a primary bronchogenic carcinoma. 2. Mild interval growth of mild left paratracheal lymphadenopathy, which was hypermetabolic on 60/01/9322 PET-CT and remain suspicious for ipsilateral mediastinal nodal metastasis. 3. No new  sites of metastatic disease in the chest. 4. Stable mild bilateral axillary lymphadenopathy, most likely due to the patient's known low grade follicular lymphoma. 5. New trace dependent left pleural effusion. 6. Stable substernal extension of 3.5 cm lower right thyroid lobe nodule. 7. Aortic atherosclerosis. 8. Diffuse hepatic steatosis. Electronically Signed   By: Ilona Sorrel M.D.   On: 06/15/2016 13:30   Ct Super D Chest Wo Contrast  Result Date: 03/16/2016 CLINICAL DATA:  Followup left hilar mass EXAM: CT CHEST WITHOUT CONTRAST TECHNIQUE: Multidetector  CT imaging of the chest was performed using thin slice collimation for electromagnetic bronchoscopy planning purposes, without intravenous contrast. COMPARISON:  12/16/2015 FINDINGS: Cardiovascular: The heart size appears normal. Aortic atherosclerosis noted. Calcification in the LAD coronary artery noted. Left circumflex coronary artery calcification also noted. Mediastinum/Nodes: No pleural fluid. The trachea appears patent and is midline. Normal appearance of the esophagus. Nodule arising from the right lobe of thyroid gland is again noted measuring 3.4 cm, image 21 of series 3. The previous pre-vascular lymph node now measures 8 mm, image 45 of series 3. Previously 1 cm. The previous left hilar mass is difficult to re-evaluate due to lack of IV contrast material. Using the same measurement technique from 12/06/2015 the area of concern measures 1.9 cm, image 41 of series 3. Previously 2 cm. Lungs/Pleura: There is no pleural fluid identified. No atelectasis or pneumonitis. No airspace consolidation. Upper Abdomen: Normal appearance of the adrenal glands. Nonspecific soft tissue stranding within the mesenteric fat is identified. No acute abnormality identified. Musculoskeletal: There is degenerative disc disease identified within the thoracic spine. No aggressive lytic or sclerotic bone lesions identified. IMPRESSION: 1. Further regression of previously enlarged prevascular lymph node which now measures 8 mm. 2. Difficult to re-evaluate due to lack of IV contrast material, the previously referenced left hilar soft tissue appears stable to decreased in the interval. Electronically Signed   By: Kerby Moors M.D.   On: 03/16/2016 14:01     Ct Super D Chest Wo Contrast  Result Date: 12/16/2015 CLINICAL DATA:  Hilar mass.  Hypertension.  History of aneurysm. EXAM: CT CHEST WITHOUT CONTRAST TECHNIQUE: Multidetector CT imaging of the chest was performed using thin slice collimation for electromagnetic bronchoscopy  planning purposes, without intravenous contrast. COMPARISON:  Multiple exams, including 09/29/2015 FINDINGS: Cardiovascular: Atherosclerotic calcification of the aortic arch. Mild cardiomegaly. Mediastinum/Nodes: Mildly hypodense 3.7 by 2.5 cm right thyroid nodule extends in the upper mediastinum, metabolic activity similar to the rest of the thyroid gland on recent PET-CT. Left suprahilar mass measuring about 2.0 cm in short axis diameter, previously 2.7 cm on 09/01/2015. Partially obscured by surrounding vasculature. Adjacent AP window lymph node 1.0 cm in short axis on image 40/3, previously 1.1 cm. Left lower paratracheal node 1.3 cm in short axis on image 39/3, previously 1.5 cm. The hilar lymph nodes shown previously are less well seen due to the lack of IV contrast. Upper normal size bilateral axillary lymph nodes. Lungs/Pleura: Despite efforts by the technologist and patient, motion artifact is present on today's exam and could not be eliminated. This reduces exam sensitivity and specificity. Left suprahilar mass is noted above. No new pulmonary nodule identified. Upper Abdomen: Unremarkable Musculoskeletal: Thoracic spondylosis with multilevel spurring. IMPRESSION: 1. The left suprahilar mass and adjacent lymph nodes appear smaller than on the prior exam, but still abnormally enlarged. 2. Hypodense right inferior thyroid nodule, similar to prior exams. Electronically Signed   By: Van Clines M.D.   On: 12/16/2015 14:53  CLINICAL DATA:Initial treatment strategy for mediastinal lymphadenopathy.  EXAM: NUCLEAR MEDICINE PET SKULL BASE TO THIGH  TECHNIQUE: 100 MCi F-18 FDG was injected intravenously. Full-ring PET imaging was performed from the skull base to thigh after the radiotracer. CT data was obtained and used for attenuation correction and anatomic localization.  FASTING BLOOD GLUCOSE:Value: 114 mg/dl  COMPARISON:CT chest 09/01/2015  FINDINGS: NECK  There is  bilateral hypermetabolic activity with the thyroid gland. There is nodular enlargement of the RIGHT lobe of thyroid gland to 2.8 by 3.4 cm. This nodule enlargement extends from the lower pole the RIGHT gland into the RIGHT upper paratracheal mediastinum (image 96 series 3). Diffuse metabolic activity favors thyroiditis with goiter.  CHEST  Hypermetabolic soft tissue thickening in the LEFT suprahilar lung / mediastinum measures 2.6 cm (image 117, series 3) with intense metabolic activity (insert SUV max 8.9).  There is a hypermetabolic LEFT lower paratracheal lymph node measures 13 mm with SUV max equal 5.9. No contralateral hypermetabolic lymph nodes. No supraclavicular lymph nodes.  Bilateral mild metabolic activity associated with axial lymph nodes. These lymph nodes have normal morphology. For example peripheral lymph node in the RIGHT axilla with SUV max equal 1.9.  ABDOMEN/PELVIS  No abnormal hypermetabolic activity within the liver, pancreas, adrenal glands, or spleen.  Hypermetabolic lymph node in the medial LEFT inguinal region with measuring 21 mm short axis node on image 338, series 3 with SUV max equal 7.0.  Within the central mesentery, prominent lymph nodes surrounded by slight haziness to the mesentery (image 221, series 3). Mild metabolic activity associated with these prominent lymph nodes (SUV max of 3.2).  Spleen is normal.  SKELETON  No focal hypermetabolic activity to suggest skeletal metastasis.  IMPRESSION: 1. Hypermetabolic LEFT suprahilar mass consists primary bronchogenic carcinoma. 2. Ipsilateral nodal metastasis to the LEFT lower paratracheal nodal station. 3. No evidence of distant metastatic consistent bronchogenic carcinoma. 4. Bilateral mildly hypermetabolic axillary lymph nodes. Prominent central mesenteric adenopathy with hazy mesentery pattern. Intensely hypermetabolic LEFT inguinal lymph node. Overall the findings raise the concern  for low-grade lymphoproliferative process. Recommend biopsy of the most hypermetabolic lesion (medial LEFT inguinal lymph node).   Electronically Signed ByOswald Hillock M.D. On: 09/29/2015 14:05 I have independently reviewed the above radiologic studies.  Recent Lab Findings: Lab Results  Component Value Date   WBC 10.2 11/15/2015   HGB 16.6 11/15/2015   HCT 49.6 11/15/2015   PLT 181 11/15/2015   GLUCOSE 137 (H) 11/15/2015   ALT 50 11/15/2015   AST 29 11/15/2015   NA 138 11/15/2015   K 4.3 11/15/2015   CL 108 11/15/2015   CREATININE 1.19 11/15/2015   BUN 15 11/15/2015   CO2 23 11/15/2015   INR 1.10 11/15/2015      Assessment / Plan:   Saddle pulmonary embolus March 2017 Relatively new onset of atrial fibrillation, appears intermittent Lifelong nonsmoker Diagnosis of follicular cell low-grade lymphoma  By needle biopsy of left groinBiopsy Left hilar mass suggestive of non-small cell lung cancer with ipsilateral nodal metastasis by PET scan- workup so far reveals negative bronchoscopy and EBUS-   Follow-up CT scan of chest done today, is significant for   Previously noted left upper lobe pulmonary nodule and left paratracheal lymphadenopathy are stable compared to the prior examination. No new pulmonary nodules are noted. 2. Interval worsening of right axillary lymphadenopathy concerning for recurrent disease in this patient with history of follicular lymphoma.  The patient will return to see medical oncology to discuss the underlying  known diagnosis of lymphoma and the advisability of proceeding with ultrasound-guided needle biopsy of the enlarging right axillary lymph node. The nodal disease in the chest appears stable, we have no definitive diagnosis of carcinoma of the lung in spite of attempts to biopsy. The patient is to play not a operative candidate for any surgical resection to treat lung malignancy. We'll continue to follow and will get a CT scan of the chest  an approximate 4 months.  Grace Isaac MD      [address] [place] [address] Office [phone]   Beeper [phone]  09/28/2016 3:38 PM

## 2016-09-29 DIAGNOSIS — D510 Vitamin B12 deficiency anemia due to intrinsic factor deficiency: Secondary | ICD-10-CM | POA: Diagnosis not present

## 2016-10-14 DIAGNOSIS — G4733 Obstructive sleep apnea (adult) (pediatric): Secondary | ICD-10-CM | POA: Diagnosis not present

## 2016-10-16 DIAGNOSIS — R590 Localized enlarged lymph nodes: Secondary | ICD-10-CM | POA: Diagnosis not present

## 2016-10-16 DIAGNOSIS — C8305 Small cell B-cell lymphoma, lymph nodes of inguinal region and lower limb: Secondary | ICD-10-CM | POA: Diagnosis not present

## 2016-10-16 DIAGNOSIS — I4891 Unspecified atrial fibrillation: Secondary | ICD-10-CM | POA: Diagnosis not present

## 2016-10-16 DIAGNOSIS — N529 Male erectile dysfunction, unspecified: Secondary | ICD-10-CM | POA: Diagnosis not present

## 2016-10-16 DIAGNOSIS — F329 Major depressive disorder, single episode, unspecified: Secondary | ICD-10-CM | POA: Diagnosis not present

## 2016-10-16 DIAGNOSIS — Z7982 Long term (current) use of aspirin: Secondary | ICD-10-CM | POA: Diagnosis not present

## 2016-10-16 DIAGNOSIS — F419 Anxiety disorder, unspecified: Secondary | ICD-10-CM | POA: Diagnosis not present

## 2016-10-16 DIAGNOSIS — I2510 Atherosclerotic heart disease of native coronary artery without angina pectoris: Secondary | ICD-10-CM | POA: Diagnosis not present

## 2016-10-16 DIAGNOSIS — N393 Stress incontinence (female) (male): Secondary | ICD-10-CM | POA: Diagnosis not present

## 2016-10-16 DIAGNOSIS — G4730 Sleep apnea, unspecified: Secondary | ICD-10-CM | POA: Diagnosis not present

## 2016-10-16 DIAGNOSIS — Z79899 Other long term (current) drug therapy: Secondary | ICD-10-CM | POA: Diagnosis not present

## 2016-10-16 DIAGNOSIS — I700 Atherosclerosis of aorta: Secondary | ICD-10-CM | POA: Diagnosis not present

## 2016-10-16 DIAGNOSIS — Z8546 Personal history of malignant neoplasm of prostate: Secondary | ICD-10-CM | POA: Diagnosis not present

## 2016-10-16 DIAGNOSIS — Z7901 Long term (current) use of anticoagulants: Secondary | ICD-10-CM | POA: Diagnosis not present

## 2016-10-16 DIAGNOSIS — R918 Other nonspecific abnormal finding of lung field: Secondary | ICD-10-CM | POA: Diagnosis not present

## 2016-10-16 DIAGNOSIS — Z803 Family history of malignant neoplasm of breast: Secondary | ICD-10-CM | POA: Diagnosis not present

## 2016-10-18 DIAGNOSIS — G4733 Obstructive sleep apnea (adult) (pediatric): Secondary | ICD-10-CM | POA: Diagnosis not present

## 2016-10-20 DIAGNOSIS — R590 Localized enlarged lymph nodes: Secondary | ICD-10-CM | POA: Diagnosis not present

## 2016-10-26 DIAGNOSIS — R918 Other nonspecific abnormal finding of lung field: Secondary | ICD-10-CM | POA: Diagnosis not present

## 2016-10-26 DIAGNOSIS — F329 Major depressive disorder, single episode, unspecified: Secondary | ICD-10-CM | POA: Diagnosis not present

## 2016-10-26 DIAGNOSIS — Z7982 Long term (current) use of aspirin: Secondary | ICD-10-CM | POA: Diagnosis not present

## 2016-10-26 DIAGNOSIS — Z7901 Long term (current) use of anticoagulants: Secondary | ICD-10-CM | POA: Diagnosis not present

## 2016-10-26 DIAGNOSIS — Z8546 Personal history of malignant neoplasm of prostate: Secondary | ICD-10-CM | POA: Diagnosis not present

## 2016-10-26 DIAGNOSIS — C8510 Unspecified B-cell lymphoma, unspecified site: Secondary | ICD-10-CM | POA: Diagnosis not present

## 2016-10-26 DIAGNOSIS — C8294 Follicular lymphoma, unspecified, lymph nodes of axilla and upper limb: Secondary | ICD-10-CM | POA: Diagnosis not present

## 2016-10-26 DIAGNOSIS — C8305 Small cell B-cell lymphoma, lymph nodes of inguinal region and lower limb: Secondary | ICD-10-CM | POA: Diagnosis not present

## 2016-10-26 DIAGNOSIS — C3490 Malignant neoplasm of unspecified part of unspecified bronchus or lung: Secondary | ICD-10-CM | POA: Diagnosis not present

## 2016-10-26 DIAGNOSIS — C8290 Follicular lymphoma, unspecified, unspecified site: Secondary | ICD-10-CM | POA: Diagnosis not present

## 2016-10-26 DIAGNOSIS — F419 Anxiety disorder, unspecified: Secondary | ICD-10-CM | POA: Diagnosis not present

## 2016-10-26 DIAGNOSIS — I4891 Unspecified atrial fibrillation: Secondary | ICD-10-CM | POA: Diagnosis not present

## 2016-10-26 DIAGNOSIS — Z79899 Other long term (current) drug therapy: Secondary | ICD-10-CM | POA: Diagnosis not present

## 2016-10-26 DIAGNOSIS — R590 Localized enlarged lymph nodes: Secondary | ICD-10-CM | POA: Diagnosis not present

## 2016-11-06 DIAGNOSIS — F411 Generalized anxiety disorder: Secondary | ICD-10-CM | POA: Diagnosis not present

## 2016-11-06 DIAGNOSIS — F4323 Adjustment disorder with mixed anxiety and depressed mood: Secondary | ICD-10-CM | POA: Diagnosis not present

## 2016-11-06 DIAGNOSIS — F330 Major depressive disorder, recurrent, mild: Secondary | ICD-10-CM | POA: Diagnosis not present

## 2016-11-07 DIAGNOSIS — D510 Vitamin B12 deficiency anemia due to intrinsic factor deficiency: Secondary | ICD-10-CM | POA: Diagnosis not present

## 2016-11-09 DIAGNOSIS — Z7982 Long term (current) use of aspirin: Secondary | ICD-10-CM | POA: Diagnosis not present

## 2016-11-09 DIAGNOSIS — C61 Malignant neoplasm of prostate: Secondary | ICD-10-CM | POA: Diagnosis not present

## 2016-11-09 DIAGNOSIS — I4891 Unspecified atrial fibrillation: Secondary | ICD-10-CM | POA: Diagnosis not present

## 2016-11-09 DIAGNOSIS — F418 Other specified anxiety disorders: Secondary | ICD-10-CM | POA: Diagnosis not present

## 2016-11-09 DIAGNOSIS — Z79899 Other long term (current) drug therapy: Secondary | ICD-10-CM | POA: Diagnosis not present

## 2016-11-09 DIAGNOSIS — R918 Other nonspecific abnormal finding of lung field: Secondary | ICD-10-CM | POA: Diagnosis not present

## 2016-11-09 DIAGNOSIS — C8295 Follicular lymphoma, unspecified, lymph nodes of inguinal region and lower limb: Secondary | ICD-10-CM | POA: Diagnosis not present

## 2016-11-09 DIAGNOSIS — Z7901 Long term (current) use of anticoagulants: Secondary | ICD-10-CM | POA: Diagnosis not present

## 2016-11-09 DIAGNOSIS — C8515 Unspecified B-cell lymphoma, lymph nodes of inguinal region and lower limb: Secondary | ICD-10-CM | POA: Diagnosis not present

## 2016-11-09 DIAGNOSIS — R590 Localized enlarged lymph nodes: Secondary | ICD-10-CM | POA: Diagnosis not present

## 2016-11-09 DIAGNOSIS — C8590 Non-Hodgkin lymphoma, unspecified, unspecified site: Secondary | ICD-10-CM | POA: Diagnosis not present

## 2016-11-13 DIAGNOSIS — H2513 Age-related nuclear cataract, bilateral: Secondary | ICD-10-CM | POA: Diagnosis not present

## 2016-11-13 DIAGNOSIS — H5203 Hypermetropia, bilateral: Secondary | ICD-10-CM | POA: Diagnosis not present

## 2016-11-13 DIAGNOSIS — H40052 Ocular hypertension, left eye: Secondary | ICD-10-CM | POA: Diagnosis not present

## 2016-11-14 DIAGNOSIS — G4733 Obstructive sleep apnea (adult) (pediatric): Secondary | ICD-10-CM | POA: Diagnosis not present

## 2016-11-20 DIAGNOSIS — G4733 Obstructive sleep apnea (adult) (pediatric): Secondary | ICD-10-CM | POA: Diagnosis not present

## 2016-11-21 DIAGNOSIS — J4520 Mild intermittent asthma, uncomplicated: Secondary | ICD-10-CM | POA: Diagnosis not present

## 2016-11-22 DIAGNOSIS — C61 Malignant neoplasm of prostate: Secondary | ICD-10-CM | POA: Diagnosis not present

## 2016-11-22 DIAGNOSIS — C8305 Small cell B-cell lymphoma, lymph nodes of inguinal region and lower limb: Secondary | ICD-10-CM | POA: Diagnosis not present

## 2016-12-01 DIAGNOSIS — D510 Vitamin B12 deficiency anemia due to intrinsic factor deficiency: Secondary | ICD-10-CM | POA: Diagnosis not present

## 2016-12-15 DIAGNOSIS — G4733 Obstructive sleep apnea (adult) (pediatric): Secondary | ICD-10-CM | POA: Diagnosis not present

## 2016-12-20 DIAGNOSIS — G4733 Obstructive sleep apnea (adult) (pediatric): Secondary | ICD-10-CM | POA: Diagnosis not present

## 2016-12-29 DIAGNOSIS — Z23 Encounter for immunization: Secondary | ICD-10-CM | POA: Diagnosis not present

## 2016-12-29 DIAGNOSIS — D510 Vitamin B12 deficiency anemia due to intrinsic factor deficiency: Secondary | ICD-10-CM | POA: Diagnosis not present

## 2017-01-04 DIAGNOSIS — F330 Major depressive disorder, recurrent, mild: Secondary | ICD-10-CM | POA: Diagnosis not present

## 2017-01-04 DIAGNOSIS — F411 Generalized anxiety disorder: Secondary | ICD-10-CM | POA: Diagnosis not present

## 2017-01-14 DIAGNOSIS — G4733 Obstructive sleep apnea (adult) (pediatric): Secondary | ICD-10-CM | POA: Diagnosis not present

## 2017-01-19 ENCOUNTER — Other Ambulatory Visit: Payer: Self-pay | Admitting: *Deleted

## 2017-01-19 DIAGNOSIS — R0602 Shortness of breath: Secondary | ICD-10-CM

## 2017-01-19 DIAGNOSIS — R911 Solitary pulmonary nodule: Secondary | ICD-10-CM

## 2017-01-30 DIAGNOSIS — G4733 Obstructive sleep apnea (adult) (pediatric): Secondary | ICD-10-CM | POA: Diagnosis not present

## 2017-01-30 DIAGNOSIS — L570 Actinic keratosis: Secondary | ICD-10-CM | POA: Diagnosis not present

## 2017-01-30 DIAGNOSIS — C44619 Basal cell carcinoma of skin of left upper limb, including shoulder: Secondary | ICD-10-CM | POA: Diagnosis not present

## 2017-01-30 DIAGNOSIS — D485 Neoplasm of uncertain behavior of skin: Secondary | ICD-10-CM | POA: Diagnosis not present

## 2017-01-30 DIAGNOSIS — L820 Inflamed seborrheic keratosis: Secondary | ICD-10-CM | POA: Diagnosis not present

## 2017-01-30 DIAGNOSIS — L578 Other skin changes due to chronic exposure to nonionizing radiation: Secondary | ICD-10-CM | POA: Diagnosis not present

## 2017-01-30 DIAGNOSIS — L821 Other seborrheic keratosis: Secondary | ICD-10-CM | POA: Diagnosis not present

## 2017-02-01 DIAGNOSIS — B430 Cutaneous chromomycosis: Secondary | ICD-10-CM | POA: Diagnosis not present

## 2017-02-07 ENCOUNTER — Encounter: Payer: Self-pay | Admitting: Cardiology

## 2017-02-07 ENCOUNTER — Ambulatory Visit (INDEPENDENT_AMBULATORY_CARE_PROVIDER_SITE_OTHER): Payer: PPO | Admitting: Cardiology

## 2017-02-07 VITALS — BP 128/60 | HR 106 | Ht 74.0 in | Wt 263.1 lb

## 2017-02-07 DIAGNOSIS — Z9889 Other specified postprocedural states: Secondary | ICD-10-CM | POA: Diagnosis not present

## 2017-02-07 DIAGNOSIS — Z8679 Personal history of other diseases of the circulatory system: Secondary | ICD-10-CM | POA: Diagnosis not present

## 2017-02-07 DIAGNOSIS — I4892 Unspecified atrial flutter: Secondary | ICD-10-CM | POA: Diagnosis not present

## 2017-02-07 NOTE — Progress Notes (Signed)
Cardiology Office Note:    Date:  02/07/2017   ID:  Edward Johnston, DOB Jul 08, 1950, MRN [id number]  PCP:  Angelina Sheriff, MD  Cardiologist:  Jenean Lindau, MD   Referring MD: Angelina Sheriff, MD    ASSESSMENT:    1. Atrial flutter, paroxysmal ([place])   2. History of cerebral aneurysm repair    PLAN:    In order of problems listed above:  1. Discussed my findings with the patient in extensive length.  His heart rate is on the higher side and he understands this.  I wanted to start him on digoxin but he is against it.  Had issues with digoxin in the past.  Therefore I asked him to double his atenolol from 50 mg in the morning and 25 mg at night.  Written instructions were given.  He will have a post blood pressure check in 1-2 weeks at his primary care physician's office with whom he has an appointment. 2. Patient will be seen in follow-up appointment in 6 months or earlier if the patient has any concerns 3. I discussed with the patient atrial fibrillation, disease process. Management and therapy including rate and rhythm control, anticoagulation benefits and potential risks were discussed extensively with the patient. Patient had multiple questions which were answered to patient's satisfaction.    Medication Adjustments/Labs and Tests Ordered: Current medicines are reviewed at length with the patient today.  Concerns regarding medicines are outlined above.  No orders of the defined types were placed in this encounter.  No orders of the defined types were placed in this encounter.    History of Present Illness:    Edward Johnston is a 66 y.o. male who is being seen today for the evaluation of paroxysmal atrial flutter at the request of [place], Angelique Blonder, MD.  The patient is a pleasant 66 year old male.  He has a past medical history of paroxysmal atrial flutter.  He denies any problems at this time and takes care of activities of daily living.  No chest pain orthopnea  or PND.  At the time of my evaluation he is alert awake oriented and in no distress.  He leads a sedentary lifestyle.  He has multiple comorbidities.  Past Medical History:  Diagnosis Date  . Anxiety   . Complication of anesthesia   . Depression   . Dysrhythmia   . Hypertension    due to psych med  . Incontinence   . Persistent atrial fibrillation ([place])   . PONV (postoperative nausea and vomiting)   . Shortness of breath dyspnea     Past Surgical History:  Procedure Laterality Date  .  aneurysm clip    . BRAIN SURGERY  1989   aneurysm clipping- [facility]  . JOINT REPLACEMENT    . PROSTATECTOMY    . REPLACEMENT TOTAL KNEE      Current Medications: Current Meds  Medication Sig  . apixaban (ELIQUIS) 5 MG TABS tablet Take 5 mg by mouth 2 (two) times daily.  . ARIPiprazole (ABILIFY) 2 MG tablet Take 1 mg by mouth at bedtime.   Marland Kitchen aspirin EC 81 MG tablet Take 81 mg by mouth daily.  Marland Kitchen atenolol (TENORMIN) 25 MG tablet Take 25 mg by mouth 2 (two) times daily.   Marland Kitchen buPROPion (WELLBUTRIN XL) 150 MG 24 hr tablet Take 300 mg by mouth daily.   . clorazepate (TRANXENE) 7.5 MG tablet Take 3.75 mg by mouth 2 (two) times daily as needed for  anxiety.   Marland Kitchen latanoprost (XALATAN) 0.005 % ophthalmic solution Place 1 drop into the left eye at bedtime.  Marland Kitchen omeprazole (PRILOSEC) 20 MG capsule Take 20 mg by mouth daily.  . Vilazodone HCl (VIIBRYD) 40 MG TABS Take 20 mg by mouth daily.      Allergies:   Digoxin   Social History   Socioeconomic History  . Marital status: Married    Spouse name: None  . Number of children: None  . Years of education: None  . Highest education level: None  Social Needs  . Financial resource strain: None  . Food insecurity - worry: None  . Food insecurity - inability: None  . Transportation needs - medical: None  . Transportation needs - non-medical: None  Occupational History  . None  Tobacco Use  . Smoking status: Never Smoker  . Smokeless tobacco: Never Used    Substance and Sexual Activity  . Alcohol use: No  . Drug use: No  . Sexual activity: Not Currently  Other Topics Concern  . None  Social History Narrative  . None     Family History: The patient's family history includes Depression in his sister.  ROS:   Please see the history of present illness.    All other systems reviewed and are negative.  EKGs/Labs/Other Studies Reviewed:    The following studies were reviewed today: I reviewed my findings with the patient had extensive length.  EKG done today reveals atrial fibrillation with fairly elevated heart rate   Recent Labs: No results found for requested labs within last 8760 hours.  Recent Lipid Panel No results found for: CHOL, TRIG, HDL, CHOLHDL, VLDL, LDLCALC, LDLDIRECT  Physical Exam:    VS:  BP 120/60   Pulse (!) 106   Ht 6\' 2"  (1.88 m)   Wt 263 lb 1.3 oz (119.3 kg)   BMI 33.78 kg/m     Wt Readings from Last 3 Encounters:  02/07/17 263 lb 1.3 oz (119.3 kg)  09/28/16 266 lb (120.7 kg)  06/15/16 282 lb (127.9 kg)     GEN: Patient is in no acute distress HEENT: Normal NECK: No JVD; No carotid bruits LYMPHATICS: No lymphadenopathy CARDIAC: S1 S2 regular, 2/6 systolic murmur at the apex. RESPIRATORY:  Clear to auscultation without rales, wheezing or rhonchi  ABDOMEN: Soft, non-tender, non-distended MUSCULOSKELETAL:  No edema; No deformity  SKIN: Warm and dry NEUROLOGIC:  Alert and oriented x 3 PSYCHIATRIC:  Normal affect    Signed, Jenean Lindau, MD  02/07/2017 11:47 AM    [place]

## 2017-02-07 NOTE — Addendum Note (Signed)
Addended by: Mattie Marlin on: 02/07/2017 03:54 PM   Modules accepted: Orders

## 2017-02-07 NOTE — Patient Instructions (Addendum)
Medication Instructions:  Your physician has recommended you make the following change in your medication:   CHANGE Atenolol 50 mg in the morning and 25 mg in the evening  Eliquis samples given  Labwork: None  Testing/Procedures: You had an EKG today  Follow-Up: Your physician recommends that you schedule a follow-up appointment in: 6 months   Any Other Special Instructions Will Be Listed Below (If Applicable).  You need to have BP/Pulse check and EKG done at Primary care physician's office in 1 week   If you need a refill on your cardiac medications before your next appointment, please call your pharmacy.   [place], RN, BSN

## 2017-02-09 DIAGNOSIS — D510 Vitamin B12 deficiency anemia due to intrinsic factor deficiency: Secondary | ICD-10-CM | POA: Diagnosis not present

## 2017-02-14 DIAGNOSIS — G4733 Obstructive sleep apnea (adult) (pediatric): Secondary | ICD-10-CM | POA: Diagnosis not present

## 2017-02-19 DIAGNOSIS — I10 Essential (primary) hypertension: Secondary | ICD-10-CM | POA: Diagnosis not present

## 2017-02-19 DIAGNOSIS — I4892 Unspecified atrial flutter: Secondary | ICD-10-CM | POA: Diagnosis not present

## 2017-02-19 DIAGNOSIS — Z6835 Body mass index (BMI) 35.0-35.9, adult: Secondary | ICD-10-CM | POA: Diagnosis not present

## 2017-02-26 DIAGNOSIS — R911 Solitary pulmonary nodule: Secondary | ICD-10-CM | POA: Diagnosis not present

## 2017-02-26 DIAGNOSIS — R918 Other nonspecific abnormal finding of lung field: Secondary | ICD-10-CM | POA: Diagnosis not present

## 2017-02-26 DIAGNOSIS — C8295 Follicular lymphoma, unspecified, lymph nodes of inguinal region and lower limb: Secondary | ICD-10-CM | POA: Diagnosis not present

## 2017-02-26 DIAGNOSIS — C8305 Small cell B-cell lymphoma, lymph nodes of inguinal region and lower limb: Secondary | ICD-10-CM | POA: Diagnosis not present

## 2017-03-01 ENCOUNTER — Ambulatory Visit: Payer: PPO | Admitting: Cardiothoracic Surgery

## 2017-03-01 ENCOUNTER — Encounter: Payer: Self-pay | Admitting: Cardiothoracic Surgery

## 2017-03-01 ENCOUNTER — Ambulatory Visit
Admission: RE | Admit: 2017-03-01 | Discharge: 2017-03-01 | Disposition: A | Discharge status: Home / Self Care | Payer: PPO | Source: Ambulatory Visit | Attending: Cardiothoracic Surgery | Admitting: Cardiothoracic Surgery

## 2017-03-01 ENCOUNTER — Other Ambulatory Visit: Payer: Self-pay

## 2017-03-01 VITALS — BP 144/93 | HR 95 | Ht 74.0 in | Wt 263.0 lb

## 2017-03-01 DIAGNOSIS — R0602 Shortness of breath: Secondary | ICD-10-CM

## 2017-03-01 DIAGNOSIS — R911 Solitary pulmonary nodule: Secondary | ICD-10-CM

## 2017-03-01 DIAGNOSIS — D510 Vitamin B12 deficiency anemia due to intrinsic factor deficiency: Secondary | ICD-10-CM | POA: Diagnosis not present

## 2017-03-01 DIAGNOSIS — R918 Other nonspecific abnormal finding of lung field: Secondary | ICD-10-CM | POA: Diagnosis not present

## 2017-03-01 DIAGNOSIS — J9859 Other diseases of mediastinum, not elsewhere classified: Secondary | ICD-10-CM

## 2017-03-01 NOTE — Progress Notes (Signed)
[address]       [place] [address]             [phone]                    Sherwin Weinheimer [place] Medical Record #[id number] Date of Birth: July 14, 1950  Referring: Nicholes Mango, MD Primary Care: Angelina Sheriff, MD  Chief Complaint:    Chief Complaint  Patient presents with  . Mediastinal Mass    5 month f/u with chest CT    History of Present Illness:    Edward Johnston 66 y.o. male  is seen in the office today with a follow-up CT scan.  I Originally performed   bronchoscopy and ebus with transbronchial biopsy of mediastinal nodes Done August 2017.  The final path  - FINE NEEDLE ASPIRATION, ENDOSCOPIC EBUS, 10L (SPECIMEN 3 OF 4 COLLECTED 11-16-2015) NO MALIGNANT CELLS IDENTIFIED. Since last seen the patient has been stable, he did note a thyroid biopsy was done recently     Patient has a history of low-grade lymphoma from a right groin biopsy. He presented to [facility] in March 2017 with saddle pulmonary embolus confirmed on CT scan . A second CT scan of the chest was done in May 2017 when he presented for routine visit to primary care was noted to be tachycardic, on this follow-up scan is noted to have a left hilar mass, bronchoscopy done by Dr Glade Lloyd in [facility] at that time.. No endobronchial lesions were noted, the patient's Eliquist  was resumed.. On 6/ 28 2017  he had a PET scan which showed left hypermetabolic suprahilar mass suggestive of primary carcinoma the lung and ipsilateral nodal metastasis to the left lower paratracheal nodes is also noted to have bilateral mildly hypermetabolic nodes on July 3 needle biopsy was performed a right inguinal node revealing follicular lymphoma low-grade.     The patient's overall activity level is extremely low spending more than 75% of his time sitting in the chair at home. He notes he gets short of breath with minimal activity such as just walking around in the yard. , He's been seen by  cardiology when he had presented several months ago with rapid atrial fibrillation.   Patient is a lifelong nonsmoker   Current Activity/ Functional Status:  Patient is independent with mobility/ambulation, transfers, ADL's, IADL's.   Zubrod Score: At the time of surgery this patient's most appropriate activity status/level should be described as: []     0    Normal activity, no symptoms []     1    Restricted in physical strenuous activity but ambulatory, able to do out light work []     2    Ambulatory and capable of self care, unable to do work activities, up and about               >50 % of waking hours                              [x]     3    Only limited self care, in bed greater than 50% of waking hours []     4    Completely disabled, no self care, confined to bed or chair []     5    Moribund   Past Medical History:  Diagnosis Date  . Anxiety   . Complication of anesthesia   .  Depression   . Dysrhythmia   . Hypertension    due to psych med  . Incontinence   . Persistent atrial fibrillation ([place])   . PONV (postoperative nausea and vomiting)   . Shortness of breath dyspnea     Past Surgical History:  Procedure Laterality Date  .  aneurysm clip    . BRAIN SURGERY  1989   aneurysm clipping- [facility]  . JOINT REPLACEMENT    . LYMPH NODE BIOPSY N/A 11/16/2015   Procedure: Biopsy of node 7,4L and 10L;  Surgeon: Grace Isaac, MD;  Location: [facility];  Service: Thoracic;  Laterality: N/A;  . PROSTATECTOMY    . REPLACEMENT TOTAL KNEE    . VIDEO BRONCHOSCOPY WITH ENDOBRONCHIAL ULTRASOUND N/A 11/16/2015   Procedure: VIDEO BRONCHOSCOPY WITH ENDOBRONCHIAL ULTRASOUND;  Surgeon: Grace Isaac, MD;  Location: [facility];  Service: Thoracic;  Laterality: N/A;    Family History  Problem Relation Age of Onset  . Depression Sister     Social History   Social History  . Marital status: Married    Spouse name: N/A  . Number of children: N/A  . Years of education: N/A   Occupational  History  . Retired previously worked on gradual    Social History Main Topics  . Smoking status: Never Smoker  . Smokeless tobacco: Not on file  . Alcohol use No  . Drug use: No  . Sexual activity: Not Currently      Social History   Tobacco Use  Smoking Status Never Smoker  Smokeless Tobacco Never Used    Social History   Substance and Sexual Activity  Alcohol Use No     Allergies  Allergen Reactions  . Digoxin Other (See Comments)    confusion    Current Outpatient Medications  Medication Sig Dispense Refill  . apixaban (ELIQUIS) 5 MG TABS tablet Take 5 mg by mouth 2 (two) times daily.    . ARIPiprazole (ABILIFY) 2 MG tablet Take 1 mg by mouth at bedtime.     Marland Kitchen aspirin EC 81 MG tablet Take 81 mg by mouth daily.    Marland Kitchen atenolol (TENORMIN) 25 MG tablet Take 50 mg 2 (two) times daily by mouth. Take 50 mg in the morning and 25 mg in the evening    . buPROPion (WELLBUTRIN XL) 150 MG 24 hr tablet Take 300 mg by mouth daily.     . clorazepate (TRANXENE) 7.5 MG tablet Take 3.75 mg by mouth 2 (two) times daily as needed for anxiety.     Marland Kitchen latanoprost (XALATAN) 0.005 % ophthalmic solution Place 1 drop into the left eye at bedtime.    Marland Kitchen omeprazole (PRILOSEC) 20 MG capsule Take 20 mg by mouth daily.    Marland Kitchen terbinafine (LAMISIL) 250 MG tablet Take 250 mg by mouth daily.    . Vilazodone HCl (VIIBRYD) 40 MG TABS Take 20 mg by mouth daily.      No current facility-administered medications for this visit.       Review of Systems:     Cardiac Review of Systems: Y or N  Chest Pain [nn   ]  Resting SOB [n ] Exertional SOB  [Y  ]  Orthopnea [Y]   Pedal Edema N   ]    Palpitations Aqua.Slicker ] Syncope N ]   Presyncope [ N  ]  General Review of Systems: [Y] = yes [  ]=no Constitional: recent weight change Aqua.Slicker ];  Wt loss over the last  3 months [   ] anorexia [  ]; fatigue [  ]; nausea [  ]; night sweats [  ]; fever [  ]; or chills [  ];          Dental: poor dentition[  ]; Last Dentist visit:     Eye : blurred vision [  ]; diplopia [   ]; vision changes [  ];  Amaurosis fugax[  ]; Resp: cough [  ];  wheezing[  ];  hemoptysis[  ]; shortness of breath[  ]; paroxysmal nocturnal dyspnea[  ]; dyspnea on exertion[  ]; or orthopnea[  ];  GI:  gallstones[  ], vomiting[  ];  dysphagia[  ]; melena[  ];  hematochezia [  ]; heartburn[  ];   Hx of  Colonoscopy[  ]; GU: kidney stones [  ]; hematuria[  ];   dysuria [  ];  nocturia[  ];  history of     obstruction [  ]; urinary frequency [  ]             Skin: rash, swelling[  ];, hair loss[  ];  peripheral edema[  ];  or itching[  ]; Musculosketetal: myalgias[  ];  joint swelling[  ];  joint erythema[  ];  joint pain[  ];  back pain[  ];  Heme/Lymph: bruising[y  ];  bleeding[  ];  anemia[  ];  Neuro: TIA[N];  headaches[  ];  stroke[  ];  vertigo[  ];  seizures[  ];   paresthesias[  ];  difficulty walking[ Y ];  Psych:depression[ y ]; anxiety[  ];  Endocrine: diabetes[  ];  thyroid dysfunction[  ];  Immunizations: Flu up to date [  ?]; Pneumococcal up to date [ > ];  Other:  Physical Exam: BP (!) 144/93   Pulse 95   Ht 6\' 2"  (1.88 m)   Wt 263 lb (119.3 kg)   SpO2 98% Comment: on RA  BMI 33.77 kg/m   PHYSICAL EXAMINATION: General appearance: slowed mentation Head: Normocephalic, no palpable cervical nodes Neck: no adenopathy, no carotid bruit, no JVD, supple, symmetrical, trachea midline and thyroid not enlarged, symmetric, no tenderness/mass/nodules Lymph nodes: Cervical, supraclavicular, and axillary nodes normal.  Examination of the axillary areas I do feel a right axillary node that easily moves around over the underlying ribs Resp: Basilar diminished breath sounds Back: Back without CVA tenderness Cardio: Regular rate and rhythm without murmur gallop GI: There is no organomegaly no splenomegaly Extremities: extremities normal, atraumatic, no cyanosis or edema and Homans sign is negative, no sign of DVT Neurologic: Mental status:  Patient is with flat affect  Diagnostic Studies & Laboratory data:     Recent Radiology Findings:  Ct Chest Wo Contrast  Result Date: 03/01/2017 CLINICAL DATA:  Followup pulmonary nodules and lymphadenopathy. History of follicular cell lymphoma. EXAM: CT CHEST WITHOUT CONTRAST TECHNIQUE: Multidetector CT imaging of the chest was performed following the standard protocol without IV contrast. COMPARISON:  PET-CT 10/26/2016 and chest CT 09/28/2016 FINDINGS: Cardiovascular: The heart is normal in size and stable. No pericardial effusion. There is mild tortuosity and calcification of the thoracic aorta. Stable scattered coronary artery calcifications. Mediastinum/Nodes: Persistent enlarged mediastinal lymph nodes. The paratracheal nodal mass on image number 21 measures 38 x 25 mm and previously measured 30 x 22 mm. Pretracheal lymph node on image number 42 measures 10.5 mm and previously measured 9 mm. Prevascular lymph node on image number 44 measures 9.5 mm and previous measured  9 mm. Aorticopulmonary window lymph node on image number 40 measures 14.5 mm and previously measured 12.5 mm. The esophagus is grossly normal. Lungs/Pleura: The left upper lobe lung lesion adjacent to the aorta on image number 39 measures 26 x 21 mm and previously measured 27 x 24 mm. No new pulmonary lesions or acute overlying pulmonary process. No pleural effusion. Upper Abdomen: No significant upper abdominal findings. Musculoskeletal: Right axillary lymph node on image number 60 measures 11.5 mm and previously measured 13 mm. 8.5 mm right axillary lymph node on image number 54 previously measured 13.5 mm. Small scattered axillary lymph nodes are noted on the left. No significant bony findings. IMPRESSION: 1. Interval enlargement of the largest right paratracheal lymph node. The other nodes are relatively stable. 2. Interval decrease in size of the right axillary lymph nodes. 3. Slight interval decrease in size of the left upper lobe  pulmonary lesion adjacent to the mediastinum. 4. No new pulmonary nodules. Aortic Atherosclerosis (ICD10-I70.0) and Emphysema (ICD10-J43.9). Electronically Signed   By: Marijo Sanes M.D.   On: 03/01/2017 16:02   Ct Chest Wo Contrast  Result Date: 09/28/2016 CLINICAL DATA:  66 year old male with history of pulmonary nodule. Follow-up study. Additional history of low-grade follicular lymphoma. EXAM: CT CHEST WITHOUT CONTRAST TECHNIQUE: Multidetector CT imaging of the chest was performed following the standard protocol without IV contrast. COMPARISON:  Chest CT 06/15/2016. FINDINGS: Cardiovascular: Heart size is mildly enlarged. There is no significant pericardial fluid, thickening or pericardial calcification. There is aortic atherosclerosis, as well as atherosclerosis of the great vessels of the mediastinum and the coronary arteries, including calcified atherosclerotic plaque in the left main, left anterior descending and left circumflex coronary arteries. Calcifications of the aortic valve. Mediastinum/Nodes: Previously noted mildly enlarged left paratracheal lymph node measuring 1.3 cm in short axis is similar to the prior examination. Several other borderline enlarged mediastinal lymph nodes are noted. Esophagus is unremarkable in appearance. Several enlarged right axillary lymph nodes measuring up to 1.3 cm in short axis (axial image 42 of series 3). 3.1 x 2.2 cm nodule in the posterior aspect of the right lobe of the thyroid gland is similar to several prior examinations. Lungs/Pleura: Previously noted nodule in the medial aspect of the left upper lobe is stable in size measuring 2.8 x 2.4 cm (axial image 41 of series 4). No other new suspicious appearing pulmonary nodules or masses are noted. There is some patchy ground-glass attenuation dependently in the right lower lobe which is nonspecific and could reflect areas of mild inflammation. No confluent consolidative airspace disease. No pleural effusions.  Upper Abdomen: Aortic atherosclerosis. Musculoskeletal: There are no aggressive appearing lytic or blastic lesions noted in the visualized portions of the skeleton. IMPRESSION: 1. Previously noted left upper lobe pulmonary nodule and left paratracheal lymphadenopathy are stable compared to the prior examination. No new pulmonary nodules are noted. 2. Interval worsening of right axillary lymphadenopathy concerning for recurrent disease in this patient with history of follicular lymphoma. 3. Aortic atherosclerosis, in addition to left main and 2 vessel coronary artery disease. Please note that although the presence of coronary artery calcium documents the presence of coronary artery disease, the severity of this disease and any potential stenosis cannot be assessed on this non-gated CT examination. Assessment for potential risk factor modification, dietary therapy or pharmacologic therapy may be warranted, if clinically indicated. 4. There are calcifications of the aortic valve. Echocardiographic correlation for evaluation of potential valvular dysfunction may be warranted if clinically indicated. Aortic Atherosclerosis (  ICD10-I70.0). Electronically Signed   By: Vinnie Langton M.D.   On: 09/28/2016 15:22   Ct Chest W Contrast  Result Date: 06/15/2016 CLINICAL DATA:  Follow-up left suprahilar pulmonary nodule, which was hypermetabolic on prior PET-CT. Tissue sampling on 11/16/2015 bronchoscopy was negative for malignancy. Patient reportedly diagnosed with low-grade follicular lymphoma on biopsy of a hypermetabolic inguinal lymph node detected on prior PET-CT. EXAM: CT CHEST WITH CONTRAST TECHNIQUE: Multidetector CT imaging of the chest was performed during intravenous contrast administration. Creatinine was obtained on site at [place] at [address] Results: Creatinine 1.1 mg/dL. CONTRAST:  31mL ISOVUE-300 IOPAMIDOL (ISOVUE-300) INJECTION 61% COMPARISON:  03/16/2016 chest CT. FINDINGS:  Cardiovascular: Stable top-normal heart size . No significant pericardial fluid/thickening. Atherosclerotic nonaneurysmal thoracic aorta. Normal caliber pulmonary arteries. No central pulmonary emboli. Mediastinum/Nodes: Stable substernal extension of inferior right thyroid lobe 3.5 cm nodule. Unremarkable esophagus. Stable mild bilateral axillary lymphadenopathy measuring up to the 1.2 cm on the right (series 3/ image 56) and 1.3 cm on the left (series 3/image 10). Enlarged 1.4 cm left paratracheal lymph node (series 3/ image 38), previously 1.2 cm, mildly increased. No additional pathologically enlarged mediastinal or hilar lymph nodes. Lungs/Pleura: No pneumothorax. New trace dependent left pleural effusion. No right pleural effusion. Central left upper lobe 2.8 x 2.4 cm pulmonary nodule (series 4/ image 38), previously 2.6 x 2.2 cm on 03/16/2016 using similar measurement technique and 2.4 x 1.8 cm on 09/01/2015, mildly increased in size. Mosaic attenuation in the left upper lobe suggests mild air trapping. No acute consolidative airspace disease or additional significant pulmonary nodules. Upper abdomen: Diffuse hepatic steatosis. Musculoskeletal: No aggressive appearing focal osseous lesions. Moderate thoracic spondylosis. IMPRESSION: 1. Mild interval growth of central left upper lobe 2.8 cm pulmonary nodule, which was hypermetabolic on 17/49/4496 PET-CT and remains suspicious for a primary bronchogenic carcinoma. 2. Mild interval growth of mild left paratracheal lymphadenopathy, which was hypermetabolic on 75/91/6384 PET-CT and remain suspicious for ipsilateral mediastinal nodal metastasis. 3. No new sites of metastatic disease in the chest. 4. Stable mild bilateral axillary lymphadenopathy, most likely due to the patient's known low grade follicular lymphoma. 5. New trace dependent left pleural effusion. 6. Stable substernal extension of 3.5 cm lower right thyroid lobe nodule. 7. Aortic atherosclerosis. 8.  Diffuse hepatic steatosis. Electronically Signed   By: Ilona Sorrel M.D.   On: 06/15/2016 13:30   Ct Super D Chest Wo Contrast  Result Date: 03/16/2016 CLINICAL DATA:  Followup left hilar mass EXAM: CT CHEST WITHOUT CONTRAST TECHNIQUE: Multidetector CT imaging of the chest was performed using thin slice collimation for electromagnetic bronchoscopy planning purposes, without intravenous contrast. COMPARISON:  12/16/2015 FINDINGS: Cardiovascular: The heart size appears normal. Aortic atherosclerosis noted. Calcification in the LAD coronary artery noted. Left circumflex coronary artery calcification also noted. Mediastinum/Nodes: No pleural fluid. The trachea appears patent and is midline. Normal appearance of the esophagus. Nodule arising from the right lobe of thyroid gland is again noted measuring 3.4 cm, image 21 of series 3. The previous pre-vascular lymph node now measures 8 mm, image 45 of series 3. Previously 1 cm. The previous left hilar mass is difficult to re-evaluate due to lack of IV contrast material. Using the same measurement technique from 12/06/2015 the area of concern measures 1.9 cm, image 41 of series 3. Previously 2 cm. Lungs/Pleura: There is no pleural fluid identified. No atelectasis or pneumonitis. No airspace consolidation. Upper Abdomen: Normal appearance of the adrenal glands. Nonspecific soft tissue stranding within the  mesenteric fat is identified. No acute abnormality identified. Musculoskeletal: There is degenerative disc disease identified within the thoracic spine. No aggressive lytic or sclerotic bone lesions identified. IMPRESSION: 1. Further regression of previously enlarged prevascular lymph node which now measures 8 mm. 2. Difficult to re-evaluate due to lack of IV contrast material, the previously referenced left hilar soft tissue appears stable to decreased in the interval. Electronically Signed   By: Kerby Moors M.D.   On: 03/16/2016 14:01     Ct Super D Chest Wo  Contrast  Result Date: 12/16/2015 CLINICAL DATA:  Hilar mass.  Hypertension.  History of aneurysm. EXAM: CT CHEST WITHOUT CONTRAST TECHNIQUE: Multidetector CT imaging of the chest was performed using thin slice collimation for electromagnetic bronchoscopy planning purposes, without intravenous contrast. COMPARISON:  Multiple exams, including 09/29/2015 FINDINGS: Cardiovascular: Atherosclerotic calcification of the aortic arch. Mild cardiomegaly. Mediastinum/Nodes: Mildly hypodense 3.7 by 2.5 cm right thyroid nodule extends in the upper mediastinum, metabolic activity similar to the rest of the thyroid gland on recent PET-CT. Left suprahilar mass measuring about 2.0 cm in short axis diameter, previously 2.7 cm on 09/01/2015. Partially obscured by surrounding vasculature. Adjacent AP window lymph node 1.0 cm in short axis on image 40/3, previously 1.1 cm. Left lower paratracheal node 1.3 cm in short axis on image 39/3, previously 1.5 cm. The hilar lymph nodes shown previously are less well seen due to the lack of IV contrast. Upper normal size bilateral axillary lymph nodes. Lungs/Pleura: Despite efforts by the technologist and patient, motion artifact is present on today's exam and could not be eliminated. This reduces exam sensitivity and specificity. Left suprahilar mass is noted above. No new pulmonary nodule identified. Upper Abdomen: Unremarkable Musculoskeletal: Thoracic spondylosis with multilevel spurring. IMPRESSION: 1. The left suprahilar mass and adjacent lymph nodes appear smaller than on the prior exam, but still abnormally enlarged. 2. Hypodense right inferior thyroid nodule, similar to prior exams. Electronically Signed   By: Van Clines M.D.   On: 12/16/2015 14:53    CLINICAL DATA:Initial treatment strategy for mediastinal lymphadenopathy.  EXAM: NUCLEAR MEDICINE PET SKULL BASE TO THIGH  TECHNIQUE: 34 MCi F-18 FDG was injected intravenously. Full-ring PET imaging was  performed from the skull base to thigh after the radiotracer. CT data was obtained and used for attenuation correction and anatomic localization.  FASTING BLOOD GLUCOSE:Value: 114 mg/dl  COMPARISON:CT chest 09/01/2015  FINDINGS: NECK  There is bilateral hypermetabolic activity with the thyroid gland. There is nodular enlargement of the RIGHT lobe of thyroid gland to 2.8 by 3.4 cm. This nodule enlargement extends from the lower pole the RIGHT gland into the RIGHT upper paratracheal mediastinum (image 96 series 3). Diffuse metabolic activity favors thyroiditis with goiter.  CHEST  Hypermetabolic soft tissue thickening in the LEFT suprahilar lung / mediastinum measures 2.6 cm (image 117, series 3) with intense metabolic activity (insert SUV max 8.9).  There is a hypermetabolic LEFT lower paratracheal lymph node measures 13 mm with SUV max equal 5.9. No contralateral hypermetabolic lymph nodes. No supraclavicular lymph nodes.  Bilateral mild metabolic activity associated with axial lymph nodes. These lymph nodes have normal morphology. For example peripheral lymph node in the RIGHT axilla with SUV max equal 1.9.  ABDOMEN/PELVIS  No abnormal hypermetabolic activity within the liver, pancreas, adrenal glands, or spleen.  Hypermetabolic lymph node in the medial LEFT inguinal region with measuring 21 mm short axis node on image 338, series 3 with SUV max equal 7.0.  Within the central mesentery, prominent  lymph nodes surrounded by slight haziness to the mesentery (image 221, series 3). Mild metabolic activity associated with these prominent lymph nodes (SUV max of 3.2).  Spleen is normal.  SKELETON  No focal hypermetabolic activity to suggest skeletal metastasis.  IMPRESSION: 1. Hypermetabolic LEFT suprahilar mass consists primary bronchogenic carcinoma. 2. Ipsilateral nodal metastasis to the LEFT lower paratracheal nodal station. 3. No evidence of distant  metastatic consistent bronchogenic carcinoma. 4. Bilateral mildly hypermetabolic axillary lymph nodes. Prominent central mesenteric adenopathy with hazy mesentery pattern. Intensely hypermetabolic LEFT inguinal lymph node. Overall the findings raise the concern for low-grade lymphoproliferative process. Recommend biopsy of the most hypermetabolic lesion (medial LEFT inguinal lymph node).   Electronically Signed ByOswald Hillock M.D. On: 09/29/2015 14:05 I have independently reviewed the above radiologic studies.  Recent Lab Findings: Lab Results  Component Value Date   WBC 10.2 11/15/2015   HGB 16.6 11/15/2015   HCT 49.6 11/15/2015   PLT 181 11/15/2015   GLUCOSE 137 (H) 11/15/2015   ALT 50 11/15/2015   AST 29 11/15/2015   NA 138 11/15/2015   K 4.3 11/15/2015   CL 108 11/15/2015   CREATININE 1.19 11/15/2015   BUN 15 11/15/2015   CO2 23 11/15/2015   INR 1.10 11/15/2015      Assessment / Plan:   Saddle pulmonary embolus March 2017 Relatively new onset of atrial fibrillation, appears intermittent Lifelong nonsmoker Diagnosis of follicular cell low-grade lymphoma  By needle biopsy of left groinBiopsy Left hilar mass suggestive of non-small cell lung cancer with ipsilateral nodal metastasis by PET scan- workup so far reveals negative bronchoscopy and EBUS-   Follow-up CT scan of chest done today, is significant for   Previously noted left upper lobe pulmonary nodule and left paratracheal lymphadenopathy have decreased in size compared to the prior examination. No new pulmonary nodules are noted. ,  Some right paratracheal lymph nodes have enlarged slightly.  The patient continues to see medical oncology being observed with diagnosis of low-grade lymphoma  The nodal disease in the chest appears stable with exception of some enlargement of the right paratracheal node, we have no definitive diagnosis of carcinoma of the lung in spite of attempts to biopsy.   We will plan  on follow-up CT of the chest in 8 months.   Grace Isaac MD      [address] [place] [address] Office [phone]   Beeper [phone]  03/01/2017 4:37 PM

## 2017-03-06 DIAGNOSIS — F330 Major depressive disorder, recurrent, mild: Secondary | ICD-10-CM | POA: Diagnosis not present

## 2017-03-06 DIAGNOSIS — F411 Generalized anxiety disorder: Secondary | ICD-10-CM | POA: Diagnosis not present

## 2017-03-06 DIAGNOSIS — G4730 Sleep apnea, unspecified: Secondary | ICD-10-CM | POA: Diagnosis not present

## 2017-03-16 DIAGNOSIS — G4733 Obstructive sleep apnea (adult) (pediatric): Secondary | ICD-10-CM | POA: Diagnosis not present

## 2017-03-30 DIAGNOSIS — D510 Vitamin B12 deficiency anemia due to intrinsic factor deficiency: Secondary | ICD-10-CM | POA: Diagnosis not present

## 2017-04-16 DIAGNOSIS — G4733 Obstructive sleep apnea (adult) (pediatric): Secondary | ICD-10-CM | POA: Diagnosis not present

## 2017-04-18 DIAGNOSIS — R5383 Other fatigue: Secondary | ICD-10-CM | POA: Diagnosis not present

## 2017-04-18 DIAGNOSIS — G4733 Obstructive sleep apnea (adult) (pediatric): Secondary | ICD-10-CM | POA: Diagnosis not present

## 2017-04-18 DIAGNOSIS — R0600 Dyspnea, unspecified: Secondary | ICD-10-CM | POA: Diagnosis not present

## 2017-04-18 DIAGNOSIS — R918 Other nonspecific abnormal finding of lung field: Secondary | ICD-10-CM | POA: Diagnosis not present

## 2017-05-04 DIAGNOSIS — D510 Vitamin B12 deficiency anemia due to intrinsic factor deficiency: Secondary | ICD-10-CM | POA: Diagnosis not present

## 2017-05-16 DIAGNOSIS — H40052 Ocular hypertension, left eye: Secondary | ICD-10-CM | POA: Diagnosis not present

## 2017-05-17 DIAGNOSIS — G4733 Obstructive sleep apnea (adult) (pediatric): Secondary | ICD-10-CM | POA: Diagnosis not present

## 2017-05-29 DIAGNOSIS — G4733 Obstructive sleep apnea (adult) (pediatric): Secondary | ICD-10-CM | POA: Diagnosis not present

## 2017-05-31 DIAGNOSIS — D510 Vitamin B12 deficiency anemia due to intrinsic factor deficiency: Secondary | ICD-10-CM | POA: Diagnosis not present

## 2017-06-08 DIAGNOSIS — B354 Tinea corporis: Secondary | ICD-10-CM | POA: Diagnosis not present

## 2017-06-08 DIAGNOSIS — L570 Actinic keratosis: Secondary | ICD-10-CM | POA: Diagnosis not present

## 2017-06-08 DIAGNOSIS — B351 Tinea unguium: Secondary | ICD-10-CM | POA: Diagnosis not present

## 2017-06-14 DIAGNOSIS — G4733 Obstructive sleep apnea (adult) (pediatric): Secondary | ICD-10-CM | POA: Diagnosis not present

## 2017-06-29 DIAGNOSIS — D510 Vitamin B12 deficiency anemia due to intrinsic factor deficiency: Secondary | ICD-10-CM | POA: Diagnosis not present

## 2017-07-05 DIAGNOSIS — F411 Generalized anxiety disorder: Secondary | ICD-10-CM | POA: Diagnosis not present

## 2017-07-05 DIAGNOSIS — F330 Major depressive disorder, recurrent, mild: Secondary | ICD-10-CM | POA: Diagnosis not present

## 2017-07-26 DIAGNOSIS — R531 Weakness: Secondary | ICD-10-CM | POA: Insufficient documentation

## 2017-07-26 DIAGNOSIS — Z7409 Other reduced mobility: Secondary | ICD-10-CM | POA: Insufficient documentation

## 2017-07-26 DIAGNOSIS — G4730 Sleep apnea, unspecified: Secondary | ICD-10-CM

## 2017-07-26 HISTORY — DX: Sleep apnea, unspecified: G47.30

## 2017-07-26 HISTORY — DX: Weakness: R53.1

## 2017-07-26 HISTORY — DX: Other reduced mobility: Z74.09

## 2017-07-27 DIAGNOSIS — R918 Other nonspecific abnormal finding of lung field: Secondary | ICD-10-CM | POA: Diagnosis not present

## 2017-07-27 DIAGNOSIS — G4733 Obstructive sleep apnea (adult) (pediatric): Secondary | ICD-10-CM | POA: Diagnosis not present

## 2017-07-27 DIAGNOSIS — R5383 Other fatigue: Secondary | ICD-10-CM | POA: Diagnosis not present

## 2017-07-31 DIAGNOSIS — G4733 Obstructive sleep apnea (adult) (pediatric): Secondary | ICD-10-CM | POA: Diagnosis not present

## 2017-08-03 ENCOUNTER — Ambulatory Visit: Payer: Self-pay | Admitting: Cardiology

## 2017-08-09 DIAGNOSIS — D510 Vitamin B12 deficiency anemia due to intrinsic factor deficiency: Secondary | ICD-10-CM | POA: Diagnosis not present

## 2017-08-21 ENCOUNTER — Ambulatory Visit: Payer: Self-pay | Admitting: Cardiology

## 2017-08-31 DIAGNOSIS — G4733 Obstructive sleep apnea (adult) (pediatric): Secondary | ICD-10-CM | POA: Diagnosis not present

## 2017-09-05 DIAGNOSIS — D510 Vitamin B12 deficiency anemia due to intrinsic factor deficiency: Secondary | ICD-10-CM | POA: Diagnosis not present

## 2017-09-05 DIAGNOSIS — R918 Other nonspecific abnormal finding of lung field: Secondary | ICD-10-CM | POA: Diagnosis not present

## 2017-09-05 DIAGNOSIS — C8305 Small cell B-cell lymphoma, lymph nodes of inguinal region and lower limb: Secondary | ICD-10-CM | POA: Diagnosis not present

## 2017-09-05 DIAGNOSIS — Z7189 Other specified counseling: Secondary | ICD-10-CM | POA: Diagnosis not present

## 2017-09-05 DIAGNOSIS — R591 Generalized enlarged lymph nodes: Secondary | ICD-10-CM | POA: Diagnosis not present

## 2017-09-05 DIAGNOSIS — I2602 Saddle embolus of pulmonary artery with acute cor pulmonale: Secondary | ICD-10-CM | POA: Diagnosis not present

## 2017-09-05 DIAGNOSIS — C8595 Non-Hodgkin lymphoma, unspecified, lymph nodes of inguinal region and lower limb: Secondary | ICD-10-CM | POA: Diagnosis not present

## 2017-09-11 ENCOUNTER — Ambulatory Visit: Payer: Self-pay | Admitting: Cardiology

## 2017-09-13 ENCOUNTER — Other Ambulatory Visit: Payer: Self-pay | Admitting: *Deleted

## 2017-09-13 DIAGNOSIS — J9859 Other diseases of mediastinum, not elsewhere classified: Secondary | ICD-10-CM

## 2017-09-19 DIAGNOSIS — I4891 Unspecified atrial fibrillation: Secondary | ICD-10-CM | POA: Diagnosis not present

## 2017-09-19 DIAGNOSIS — J069 Acute upper respiratory infection, unspecified: Secondary | ICD-10-CM | POA: Diagnosis not present

## 2017-09-19 DIAGNOSIS — R0789 Other chest pain: Secondary | ICD-10-CM | POA: Diagnosis not present

## 2017-09-19 DIAGNOSIS — C8590 Non-Hodgkin lymphoma, unspecified, unspecified site: Secondary | ICD-10-CM | POA: Diagnosis not present

## 2017-09-20 ENCOUNTER — Encounter: Payer: Self-pay | Admitting: Cardiology

## 2017-09-20 ENCOUNTER — Ambulatory Visit (INDEPENDENT_AMBULATORY_CARE_PROVIDER_SITE_OTHER): Payer: PPO | Admitting: Cardiology

## 2017-09-20 VITALS — BP 132/70 | HR 86 | Ht 74.0 in | Wt 279.0 lb

## 2017-09-20 DIAGNOSIS — I4892 Unspecified atrial flutter: Secondary | ICD-10-CM

## 2017-09-20 DIAGNOSIS — Z7901 Long term (current) use of anticoagulants: Secondary | ICD-10-CM | POA: Diagnosis not present

## 2017-09-20 DIAGNOSIS — G4730 Sleep apnea, unspecified: Secondary | ICD-10-CM | POA: Diagnosis not present

## 2017-09-20 DIAGNOSIS — I482 Chronic atrial fibrillation: Secondary | ICD-10-CM

## 2017-09-20 DIAGNOSIS — Z86711 Personal history of pulmonary embolism: Secondary | ICD-10-CM | POA: Diagnosis not present

## 2017-09-20 DIAGNOSIS — I2602 Saddle embolus of pulmonary artery with acute cor pulmonale: Secondary | ICD-10-CM

## 2017-09-20 DIAGNOSIS — I4821 Permanent atrial fibrillation: Secondary | ICD-10-CM

## 2017-09-20 HISTORY — DX: Permanent atrial fibrillation: I48.21

## 2017-09-20 HISTORY — DX: Long term (current) use of anticoagulants: Z79.01

## 2017-09-20 MED ORDER — METOPROLOL SUCCINATE ER 100 MG PO TB24: 100.0000 mg | ORAL_TABLET | Freq: Every day | ORAL | 1 refills | Status: DC

## 2017-09-20 NOTE — Progress Notes (Signed)
Cardiology Office Note:    Date:  09/20/2017   ID:  Edward Johnston, DOB March 25, 1951, MRN [id number]  PCP:  Angelina Sheriff, MD  Cardiologist:  Jenean Lindau, MD   Referring MD: Angelina Sheriff, MD    ASSESSMENT:    1. Acute saddle pulmonary embolism with acute cor pulmonale (HCC)   2. Atrial flutter, paroxysmal ([place])   3. Sleep apnea, unspecified type   4. History of pulmonary embolism    PLAN:    In order of problems listed above:  1. I discussed my findings with the patient had extensive length.I discussed with the patient atrial fibrillation, disease process. Management and therapy including rate and rhythm control, anticoagulation benefits and potential risks were discussed extensively with the patient. Patient had multiple questions which were answered to patient's satisfaction. 2. The patient's heart rates are pretty stable they are mildly elevated.  Therefore I have asked the patient to increase atenolol to 50 mg twice daily.  Once he is exhausted with his medications will switch over to Toprol-XL 100 mg daily and a prescription of this is already been sent and the patient has been made aware for it.  Once this time the medicine they will keep a track of her pulse blood pressure and get back to [place].  He will be seen in follow-up appointment in 2 months or earlier if he has any concerns.  Fall precautions were advised.   Medication Adjustments/Labs and Tests Ordered: Current medicines are reviewed at length with the patient today.  Concerns regarding medicines are outlined above.  No orders of the defined types were placed in this encounter.  No orders of the defined types were placed in this encounter.    No chief complaint on file.    History of Present Illness:    Edward Johnston is a 67 y.o. male.  The patient has history of pulmonary embolism and is on anticoagulation.  He also has persistent atrial fibrillation.  Patient went to see his emergency room  doctor with an upper respiratory tract infection and was treated.  EKG also revealed atrial fibrillation with mildly elevated levels.  Patient denies any problems at this time and takes care of activities of daily living.  He leads a sedentary lifestyle.  No history of fever cough chills or any such issues that suggest a bacterial infection.  No chest pain or shortness of breath.  At the time of my evaluation, the patient is alert awake oriented and in no distress.  Past Medical History:  Diagnosis Date  . Anxiety   . Complication of anesthesia   . Depression   . Dysrhythmia   . Hypertension    due to psych med  . Incontinence   . Persistent atrial fibrillation ([place])   . PONV (postoperative nausea and vomiting)   . Shortness of breath dyspnea     Past Surgical History:  Procedure Laterality Date  .  aneurysm clip    . BRAIN SURGERY  1989   aneurysm clipping- [facility]  . JOINT REPLACEMENT    . LYMPH NODE BIOPSY N/A 11/16/2015   Procedure: Biopsy of node 7,4L and 10L;  Surgeon: Grace Isaac, MD;  Location: [facility];  Service: Thoracic;  Laterality: N/A;  . PROSTATECTOMY    . REPLACEMENT TOTAL KNEE    . VIDEO BRONCHOSCOPY WITH ENDOBRONCHIAL ULTRASOUND N/A 11/16/2015   Procedure: VIDEO BRONCHOSCOPY WITH ENDOBRONCHIAL ULTRASOUND;  Surgeon: Grace Isaac, MD;  Location: [facility];  Service:  Thoracic;  Laterality: N/A;    Current Medications: Current Meds  Medication Sig  . apixaban (ELIQUIS) 5 MG TABS tablet Take 5 mg by mouth 2 (two) times daily.  . ARIPiprazole (ABILIFY) 2 MG tablet Take 1 mg by mouth at bedtime.   Marland Kitchen aspirin EC 81 MG tablet Take 81 mg by mouth daily.  Marland Kitchen atenolol (TENORMIN) 25 MG tablet Take 50 mg 2 (two) times daily by mouth. Take 50 mg in the morning and 25 mg in the evening  . buPROPion (WELLBUTRIN XL) 150 MG 24 hr tablet Take 300 mg by mouth daily.   . clorazepate (TRANXENE) 7.5 MG tablet Take 3.75 mg by mouth 2 (two) times daily as needed for anxiety.   Marland Kitchen  latanoprost (XALATAN) 0.005 % ophthalmic solution Place 1 drop into the left eye at bedtime.  Marland Kitchen omeprazole (PRILOSEC) 20 MG capsule Take 20 mg by mouth daily.  Marland Kitchen terbinafine (LAMISIL) 250 MG tablet Take 250 mg by mouth daily.  . Vilazodone HCl (VIIBRYD) 40 MG TABS Take 20 mg by mouth daily.      Allergies:   Digoxin   Social History   Socioeconomic History  . Marital status: Married    Spouse name: Not on file  . Number of children: Not on file  . Years of education: Not on file  . Highest education level: Not on file  Occupational History  . Not on file  Social Needs  . Financial resource strain: Not on file  . Food insecurity:    Worry: Not on file    Inability: Not on file  . Transportation needs:    Medical: Not on file    Non-medical: Not on file  Tobacco Use  . Smoking status: Never Smoker  . Smokeless tobacco: Never Used  Substance and Sexual Activity  . Alcohol use: No  . Drug use: No  . Sexual activity: Not Currently  Lifestyle  . Physical activity:    Days per week: Not on file    Minutes per session: Not on file  . Stress: Not on file  Relationships  . Social connections:    Talks on phone: Not on file    Gets together: Not on file    Attends religious service: Not on file    Active member of club or organization: Not on file    Attends meetings of clubs or organizations: Not on file    Relationship status: Not on file  Other Topics Concern  . Not on file  Social History Narrative  . Not on file     Family History: The patient's family history includes Depression in his sister.  ROS:   Please see the history of present illness.    All other systems reviewed and are negative.  EKGs/Labs/Other Studies Reviewed:    The following studies were reviewed today: I reviewed records including blood work and EKG from urgent care.   Recent Labs: No results found for requested labs within last 8760 hours.  Recent Lipid Panel No results found for:  CHOL, TRIG, HDL, CHOLHDL, VLDL, LDLCALC, LDLDIRECT  Physical Exam:    VS:  BP 132/70 (BP Location: Left Arm, Patient Position: Sitting, Cuff Size: Normal)   Pulse 86   Ht 6\' 2"  (1.88 m)   Wt 279 lb (126.6 kg)   SpO2 98%   BMI 35.82 kg/m     Wt Readings from Last 3 Encounters:  09/20/17 279 lb (126.6 kg)  03/01/17 263 lb (119.3 kg)  02/07/17 263 lb 1.3 oz (119.3 kg)     GEN: Patient is in no acute distress HEENT: Normal NECK: No JVD; No carotid bruits LYMPHATICS: No lymphadenopathy CARDIAC: Hear sounds regular, 2/6 systolic murmur at the apex. RESPIRATORY:  Clear to auscultation without rales, wheezing or rhonchi  ABDOMEN: Soft, non-tender, non-distended MUSCULOSKELETAL:  No edema; No deformity  SKIN: Warm and dry NEUROLOGIC:  Alert and oriented x 3 PSYCHIATRIC:  Normal affect   Signed, Jenean Lindau, MD  09/20/2017 9:11 AM    [place]

## 2017-09-20 NOTE — Patient Instructions (Addendum)
Medication Instructions:  Your physician has recommended you make the following change in your medication:  CHANGE atenolol to 50 mg twice daily until this medication runs out START Metoprolol XL 100 mg daily  Labwork: None  Testing/Procedures: None  Follow-Up: Your physician recommends that you schedule a follow-up appointment in: 2 months  Any Other Special Instructions Will Be Listed Below (If Applicable).     If you need a refill on your cardiac medications before your next appointment, please call your pharmacy.   [place], RN, BSN  Metoprolol extended-release tablets What is this medicine? METOPROLOL (me TOE proe lole) is a beta-blocker. Beta-blockers reduce the workload on the heart and help it to beat more regularly. This medicine is used to treat high blood pressure and to prevent chest pain. It is also used to after a heart attack and to prevent an additional heart attack from occurring. This medicine may be used for other purposes; ask your health care provider or pharmacist if you have questions. COMMON BRAND NAME(S): toprol, Toprol XL What should I tell my health care provider before I take this medicine? They need to know if you have any of these conditions: -diabetes -heart or vessel disease like slow heart rate, worsening heart failure, heart block, sick sinus syndrome or Raynaud's disease -kidney disease -liver disease -lung or breathing disease, like asthma or emphysema -pheochromocytoma -thyroid disease -an unusual or allergic reaction to metoprolol, other beta-blockers, medicines, foods, dyes, or preservatives -pregnant or trying to get pregnant -breast-feeding How should I use this medicine? Take this medicine by mouth with a glass of water. Follow the directions on the prescription label. Do not crush or chew. Take this medicine with or immediately after meals. Take your doses at regular intervals. Do not take more medicine than  directed. Do not stop taking this medicine suddenly. This could lead to serious heart-related effects. Talk to your pediatrician regarding the use of this medicine in children. While this drug may be prescribed for children as young as 6 years for selected conditions, precautions do apply. Overdosage: If you think you have taken too much of this medicine contact a poison control center or emergency room at once. NOTE: This medicine is only for you. Do not share this medicine with others. What if I miss a dose? If you miss a dose, take it as soon as you can. If it is almost time for your next dose, take only that dose. Do not take double or extra doses. What may interact with this medicine? This medicine may interact with the following medications: -certain medicines for blood pressure, heart disease, irregular heart beat -certain medicines for depression, like monoamine oxidase (MAO) inhibitors, fluoxetine, or paroxetine -clonidine -dobutamine -epinephrine -isoproterenol -reserpine This list may not describe all possible interactions. Give your health care provider a list of all the medicines, herbs, non-prescription drugs, or dietary supplements you use. Also tell them if you smoke, drink alcohol, or use illegal drugs. Some items may interact with your medicine. What should I watch for while using this medicine? Visit your doctor or health care professional for regular check ups. Contact your doctor right away if your symptoms worsen. Check your blood pressure and pulse rate regularly. Ask your health care professional what your blood pressure and pulse rate should be, and when you should contact them. You may get drowsy or dizzy. Do not drive, use machinery, or do anything that needs mental alertness until you know how this medicine  affects you. Do not sit or stand up quickly, especially if you are an older patient. This reduces the risk of dizzy or fainting spells. Contact your doctor if these  symptoms continue. Alcohol may interfere with the effect of this medicine. Avoid alcoholic drinks. What side effects may I notice from receiving this medicine? Side effects that you should report to your doctor or health care professional as soon as possible: -allergic reactions like skin rash, itching or hives -cold or numb hands or feet -depression -difficulty breathing -faint -fever with sore throat -irregular heartbeat, chest pain -rapid weight gain -swollen legs or ankles Side effects that usually do not require medical attention (report to your doctor or health care professional if they continue or are bothersome): -anxiety or nervousness -change in sex drive or performance -dry skin -headache -nightmares or trouble sleeping -short term memory loss -stomach upset or diarrhea -unusually tired This list may not describe all possible side effects. Call your doctor for medical advice about side effects. You may report side effects to FDA at 1-800-FDA-1088. Where should I keep my medicine? Keep out of the reach of children. Store at room temperature between 15 and 30 degrees C (59 and 86 degrees F). Throw away any unused medicine after the expiration date. NOTE: This sheet is a summary. It may not cover all possible information. If you have questions about this medicine, talk to your doctor, pharmacist, or health care provider.  2018 [employer] Standard (2012-11-22 14:41:37)

## 2017-09-26 DIAGNOSIS — Z125 Encounter for screening for malignant neoplasm of prostate: Secondary | ICD-10-CM | POA: Diagnosis not present

## 2017-09-26 DIAGNOSIS — Z6835 Body mass index (BMI) 35.0-35.9, adult: Secondary | ICD-10-CM | POA: Diagnosis not present

## 2017-09-26 DIAGNOSIS — Z79899 Other long term (current) drug therapy: Secondary | ICD-10-CM | POA: Diagnosis not present

## 2017-09-26 DIAGNOSIS — K760 Fatty (change of) liver, not elsewhere classified: Secondary | ICD-10-CM | POA: Diagnosis not present

## 2017-09-26 DIAGNOSIS — Z0000 Encounter for general adult medical examination without abnormal findings: Secondary | ICD-10-CM | POA: Diagnosis not present

## 2017-09-26 DIAGNOSIS — Z1339 Encounter for screening examination for other mental health and behavioral disorders: Secondary | ICD-10-CM | POA: Diagnosis not present

## 2017-09-26 DIAGNOSIS — E755 Other lipid storage disorders: Secondary | ICD-10-CM | POA: Diagnosis not present

## 2017-09-26 DIAGNOSIS — Z9181 History of falling: Secondary | ICD-10-CM | POA: Diagnosis not present

## 2017-09-26 DIAGNOSIS — D510 Vitamin B12 deficiency anemia due to intrinsic factor deficiency: Secondary | ICD-10-CM | POA: Diagnosis not present

## 2017-09-26 DIAGNOSIS — E785 Hyperlipidemia, unspecified: Secondary | ICD-10-CM | POA: Diagnosis not present

## 2017-09-26 DIAGNOSIS — Z23 Encounter for immunization: Secondary | ICD-10-CM | POA: Diagnosis not present

## 2017-09-26 DIAGNOSIS — J329 Chronic sinusitis, unspecified: Secondary | ICD-10-CM | POA: Diagnosis not present

## 2017-10-02 DIAGNOSIS — G4733 Obstructive sleep apnea (adult) (pediatric): Secondary | ICD-10-CM | POA: Diagnosis not present

## 2017-10-03 DIAGNOSIS — D510 Vitamin B12 deficiency anemia due to intrinsic factor deficiency: Secondary | ICD-10-CM | POA: Diagnosis not present

## 2017-10-08 ENCOUNTER — Ambulatory Visit: Payer: Self-pay | Admitting: Cardiology

## 2017-10-16 ENCOUNTER — Other Ambulatory Visit: Payer: Self-pay

## 2017-10-30 DIAGNOSIS — D510 Vitamin B12 deficiency anemia due to intrinsic factor deficiency: Secondary | ICD-10-CM | POA: Diagnosis not present

## 2017-10-31 ENCOUNTER — Ambulatory Visit: Payer: PPO | Admitting: Cardiothoracic Surgery

## 2017-10-31 ENCOUNTER — Ambulatory Visit
Admission: RE | Admit: 2017-10-31 | Discharge: 2017-10-31 | Disposition: A | Discharge status: Home / Self Care | Payer: PPO | Source: Ambulatory Visit | Attending: Cardiothoracic Surgery | Admitting: Cardiothoracic Surgery

## 2017-10-31 VITALS — BP 126/84 | HR 85 | Resp 20 | Ht 74.0 in | Wt 279.0 lb

## 2017-10-31 DIAGNOSIS — J9859 Other diseases of mediastinum, not elsewhere classified: Secondary | ICD-10-CM

## 2017-10-31 DIAGNOSIS — R911 Solitary pulmonary nodule: Secondary | ICD-10-CM

## 2017-10-31 DIAGNOSIS — R918 Other nonspecific abnormal finding of lung field: Secondary | ICD-10-CM

## 2017-10-31 NOTE — Progress Notes (Signed)
[address]       [place] [address]             [phone]                    Zaheer Buehl [place] Medical Record #[id number] Date of Birth: [birth year]  Referring: Dionisio David, MD Primary Care: Angelina Sheriff, MD  Chief Complaint:    Chief Complaint  Patient presents with  . Mediastinal Mass    8 month f/u with Chest CT    History of Present Illness:    Edward Johnston 67 y.o. male  is seen in the office today with a follow-up CT scan.  I performed bronchoscopy and ebus with transbronchial biopsy of mediastinal nodes August 2017.  The final path  - FINE NEEDLE ASPIRATION, ENDOSCOPIC EBUS, 10L (SPECIMEN 3 OF 4 COLLECTED 11-16-2015) NO MALIGNANT CELLS IDENTIFIED. Since last seen the patient has been stable.  Since last seen the patient has had no new symptoms.  His wife does note that at times he gets short of breath with minimal exertion.  He has had no hemoptysis.  No fever or chills.  Patient has a history of low-grade lymphoma from a right groin biopsy. He presented to [facility] in March 2017 with saddle pulmonary embolus confirmed on CT scan . A second CT scan of the chest was done in May 2017 when he presented for routine visit to primary care was noted to be tachycardic, on this follow-up scan is noted to have a left hilar mass, bronchoscopy done by Dr Glade Lloyd in [facility] at that time.. No endobronchial lesions were noted, the patient's Eliquist  was resumed.. On 6/ 28 2017  he had a PET scan which showed left hypermetabolic suprahilar mass suggestive of primary carcinoma the lung and ipsilateral nodal metastasis to the left lower paratracheal nodes is also noted to have bilateral mildly hypermetabolic nodes on July 3 needle biopsy was performed a right inguinal node revealing follicular lymphoma low-grade.     Patient's overall activity level remains very low, spending more than 75% of his time sitting in the chair.  He is very  limited by shortness of breath with exertion.  He continues to be followed by Dr. Chancy Milroy for low-grade lymphoma Patient is a lifelong nonsmoker   Current Activity/ Functional Status:  Patient is independent with mobility/ambulation, transfers, ADL's, IADL's.   Zubrod Score: At the time of surgery this patient's most appropriate activity status/level should be described as: []     0    Normal activity, no symptoms []     1    Restricted in physical strenuous activity but ambulatory, able to do out light work []     2    Ambulatory and capable of self care, unable to do work activities, up and about               >50 % of waking hours                              [x]     3    Only limited self care, in bed greater than 50% of waking hours []     4    Completely disabled, no self care, confined to bed or chair []     5    Moribund   Past Medical History:  Diagnosis Date  . Anxiety   .  Complication of anesthesia   . Depression   . Dysrhythmia   . Hypertension    due to psych med  . Incontinence   . Persistent atrial fibrillation ([place])   . PONV (postoperative nausea and vomiting)   . Shortness of breath dyspnea     Past Surgical History:  Procedure Laterality Date  .  aneurysm clip    . BRAIN SURGERY  1989   aneurysm clipping- [facility]  . JOINT REPLACEMENT    . LYMPH NODE BIOPSY N/A 11/16/2015   Procedure: Biopsy of node 7,4L and 10L;  Surgeon: Grace Isaac, MD;  Location: [facility];  Service: Thoracic;  Laterality: N/A;  . PROSTATECTOMY    . REPLACEMENT TOTAL KNEE    . VIDEO BRONCHOSCOPY WITH ENDOBRONCHIAL ULTRASOUND N/A 11/16/2015   Procedure: VIDEO BRONCHOSCOPY WITH ENDOBRONCHIAL ULTRASOUND;  Surgeon: Grace Isaac, MD;  Location: [facility];  Service: Thoracic;  Laterality: N/A;    Family History  Problem Relation Age of Onset  . Depression Sister     Social History   Social History  . Marital status: Married    Spouse name: N/A  . Number of children: N/A  . Years of  education: N/A   Occupational History  . Retired previously worked on gradual    Social History Main Topics  . Smoking status: Never Smoker  . Smokeless tobacco: Not on file  . Alcohol use No  . Drug use: No  . Sexual activity: Not Currently      Social History   Tobacco Use  Smoking Status Never Smoker  Smokeless Tobacco Never Used    Social History   Substance and Sexual Activity  Alcohol Use No     Allergies  Allergen Reactions  . Digoxin Other (See Comments)    confusion    Current Outpatient Medications  Medication Sig Dispense Refill  . apixaban (ELIQUIS) 5 MG TABS tablet Take 5 mg by mouth 2 (two) times daily.    . ARIPiprazole (ABILIFY) 2 MG tablet Take 1 mg by mouth at bedtime.     Marland Kitchen aspirin EC 81 MG tablet Take 81 mg by mouth daily.    Marland Kitchen buPROPion (WELLBUTRIN XL) 150 MG 24 hr tablet Take 300 mg by mouth daily.     . clorazepate (TRANXENE) 7.5 MG tablet Take 3.75 mg by mouth 2 (two) times daily as needed for anxiety.     . clotrimazole-betamethasone (LOTRISONE) cream Apply 1 application topically as needed.  4  . latanoprost (XALATAN) 0.005 % ophthalmic solution Place 1 drop into the left eye at bedtime.    . metoprolol succinate (TOPROL-XL) 100 MG 24 hr tablet Take 1 tablet (100 mg total) by mouth daily. Take with or immediately following a meal. 90 tablet 1  . omeprazole (PRILOSEC) 20 MG capsule Take 20 mg by mouth daily.    Marland Kitchen terbinafine (LAMISIL) 250 MG tablet Take 250 mg by mouth daily.    . Vilazodone HCl (VIIBRYD) 40 MG TABS Take 20 mg by mouth daily.      No current facility-administered medications for this visit.       Review of Systems:  ROS   Physical Exam: BP 126/84   Pulse 85   Resp 20   Ht 6\' 2"  (1.88 m)   Wt 279 lb (126.6 kg)   BMI 35.82 kg/m   PHYSICAL EXAMINATION: General appearance: alert, cooperative, appears older than stated age and slowed mentation Head: Normocephalic, without obvious abnormality, atraumatic Neck: no  adenopathy,  no carotid bruit, no JVD, supple, symmetrical, trachea midline and thyroid not enlarged, symmetric, no tenderness/mass/nodules Lymph nodes: Cervical, supraclavicular, and axillary nodes normal. Resp: clear to auscultation bilaterally Back: symmetric, no curvature. ROM normal. No CVA tenderness. Cardio: regular rate and rhythm, S1, S2 normal, no murmur, click, rub or gallop GI: soft, non-tender; bowel sounds normal; no masses,  no organomegaly Extremities: extremities normal, atraumatic, no cyanosis or edema and Homans sign is negative, no sign of DVT Neurologic: Grossly normal, patient has very flat affect and does not speak much.  Diagnostic Studies & Laboratory data:     Recent Radiology Findings:  Ct Chest Wo Contrast  Result Date: 10/31/2017 CLINICAL DATA:  Nodules and lymphadenopathy EXAM: CT CHEST WITHOUT CONTRAST TECHNIQUE: Multidetector CT imaging of the chest was performed following the standard protocol without IV contrast. COMPARISON:  03/01/2017 FINDINGS: Cardiovascular: The heart size is normal. No substantial pericardial effusion. Coronary artery calcification is evident. Atherosclerotic calcification is noted in the wall of the thoracic aorta. Mediastinum/Nodes: Similar mediastinal lymphadenopathy. Right paratracheal node (2:25) is 2.5 x 4.0 cm today compared to 2.5 x 3.8 cm previously. 12 mm right axillary index lymph node measured previously now measures 15 mm (2:56). The esophagus has normal imaging features. Lungs/Pleura: The central tracheobronchial airways are patent. Left suprahilar lesion measures 2.6 x 2.8 cm today compared to 2.1 x 2.6 cm previously. Scattered small ground-glass nodules in the lungs show mild interval increase. Representative right upper lobe nodule (8:37) is 8 mm today compared to 6 mm previously. Lingular atelectasis or scarring. Upper Abdomen: Mesenteric lymph nodes in the left upper abdomen has been incompletely visualized. Musculoskeletal: No  worrisome lytic or sclerotic osseous abnormality. IMPRESSION: 1. Generalized slight interval progression of mediastinal and right hilar lymphadenopathy. 2. Left suprahilar lesion also shows slight interval progression. 3. Scattered tiny ground-glass nodules are stable to minimally progressed in the interval. 4.  Aortic Atherosclerois (ICD10-170.0) Electronically Signed   By: Misty Stanley M.D.   On: 10/31/2017 15:15   I have independently reviewed the above radiology studies  and reviewed the findings with the patient.    Ct Chest Wo Contrast  Result Date: 03/01/2017 CLINICAL DATA:  Followup pulmonary nodules and lymphadenopathy. History of follicular cell lymphoma. EXAM: CT CHEST WITHOUT CONTRAST TECHNIQUE: Multidetector CT imaging of the chest was performed following the standard protocol without IV contrast. COMPARISON:  PET-CT 10/26/2016 and chest CT 09/28/2016 FINDINGS: Cardiovascular: The heart is normal in size and stable. No pericardial effusion. There is mild tortuosity and calcification of the thoracic aorta. Stable scattered coronary artery calcifications. Mediastinum/Nodes: Persistent enlarged mediastinal lymph nodes. The paratracheal nodal mass on image number 21 measures 38 x 25 mm and previously measured 30 x 22 mm. Pretracheal lymph node on image number 42 measures 10.5 mm and previously measured 9 mm. Prevascular lymph node on image number 44 measures 9.5 mm and previous measured 9 mm. Aorticopulmonary window lymph node on image number 40 measures 14.5 mm and previously measured 12.5 mm. The esophagus is grossly normal. Lungs/Pleura: The left upper lobe lung lesion adjacent to the aorta on image number 39 measures 26 x 21 mm and previously measured 27 x 24 mm. No new pulmonary lesions or acute overlying pulmonary process. No pleural effusion. Upper Abdomen: No significant upper abdominal findings. Musculoskeletal: Right axillary lymph node on image number 60 measures 11.5 mm and previously  measured 13 mm. 8.5 mm right axillary lymph node on image number 54 previously measured 13.5 mm. Small scattered axillary lymph  nodes are noted on the left. No significant bony findings. IMPRESSION: 1. Interval enlargement of the largest right paratracheal lymph node. The other nodes are relatively stable. 2. Interval decrease in size of the right axillary lymph nodes. 3. Slight interval decrease in size of the left upper lobe pulmonary lesion adjacent to the mediastinum. 4. No new pulmonary nodules. Aortic Atherosclerosis (ICD10-I70.0) and Emphysema (ICD10-J43.9). Electronically Signed   By: Marijo Sanes M.D.   On: 03/01/2017 16:02   Ct Chest Wo Contrast  Result Date: 09/28/2016 CLINICAL DATA:  67 year old male with history of pulmonary nodule. Follow-up study. Additional history of low-grade follicular lymphoma. EXAM: CT CHEST WITHOUT CONTRAST TECHNIQUE: Multidetector CT imaging of the chest was performed following the standard protocol without IV contrast. COMPARISON:  Chest CT 06/15/2016. FINDINGS: Cardiovascular: Heart size is mildly enlarged. There is no significant pericardial fluid, thickening or pericardial calcification. There is aortic atherosclerosis, as well as atherosclerosis of the great vessels of the mediastinum and the coronary arteries, including calcified atherosclerotic plaque in the left main, left anterior descending and left circumflex coronary arteries. Calcifications of the aortic valve. Mediastinum/Nodes: Previously noted mildly enlarged left paratracheal lymph node measuring 1.3 cm in short axis is similar to the prior examination. Several other borderline enlarged mediastinal lymph nodes are noted. Esophagus is unremarkable in appearance. Several enlarged right axillary lymph nodes measuring up to 1.3 cm in short axis (axial image 42 of series 3). 3.1 x 2.2 cm nodule in the posterior aspect of the right lobe of the thyroid gland is similar to several prior examinations.  Lungs/Pleura: Previously noted nodule in the medial aspect of the left upper lobe is stable in size measuring 2.8 x 2.4 cm (axial image 41 of series 4). No other new suspicious appearing pulmonary nodules or masses are noted. There is some patchy ground-glass attenuation dependently in the right lower lobe which is nonspecific and could reflect areas of mild inflammation. No confluent consolidative airspace disease. No pleural effusions. Upper Abdomen: Aortic atherosclerosis. Musculoskeletal: There are no aggressive appearing lytic or blastic lesions noted in the visualized portions of the skeleton. IMPRESSION: 1. Previously noted left upper lobe pulmonary nodule and left paratracheal lymphadenopathy are stable compared to the prior examination. No new pulmonary nodules are noted. 2. Interval worsening of right axillary lymphadenopathy concerning for recurrent disease in this patient with history of follicular lymphoma. 3. Aortic atherosclerosis, in addition to left main and 2 vessel coronary artery disease. Please note that although the presence of coronary artery calcium documents the presence of coronary artery disease, the severity of this disease and any potential stenosis cannot be assessed on this non-gated CT examination. Assessment for potential risk factor modification, dietary therapy or pharmacologic therapy may be warranted, if clinically indicated. 4. There are calcifications of the aortic valve. Echocardiographic correlation for evaluation of potential valvular dysfunction may be warranted if clinically indicated. Aortic Atherosclerosis (ICD10-I70.0). Electronically Signed   By: Vinnie Langton M.D.   On: 09/28/2016 15:22   Ct Chest W Contrast  Result Date: 06/15/2016 CLINICAL DATA:  Follow-up left suprahilar pulmonary nodule, which was hypermetabolic on prior PET-CT. Tissue sampling on 11/16/2015 bronchoscopy was negative for malignancy. Patient reportedly diagnosed with low-grade follicular  lymphoma on biopsy of a hypermetabolic inguinal lymph node detected on prior PET-CT. EXAM: CT CHEST WITH CONTRAST TECHNIQUE: Multidetector CT imaging of the chest was performed during intravenous contrast administration. Creatinine was obtained on site at [facility] at [address] Results: Creatinine 1.1 mg/dL. CONTRAST:  37mL ISOVUE-300 IOPAMIDOL (ISOVUE-300) INJECTION 61% COMPARISON:  03/16/2016 chest CT. FINDINGS: Cardiovascular: Stable top-normal heart size . No significant pericardial fluid/thickening. Atherosclerotic nonaneurysmal thoracic aorta. Normal caliber pulmonary arteries. No central pulmonary emboli. Mediastinum/Nodes: Stable substernal extension of inferior right thyroid lobe 3.5 cm nodule. Unremarkable esophagus. Stable mild bilateral axillary lymphadenopathy measuring up to the 1.2 cm on the right (series 3/ image 56) and 1.3 cm on the left (series 3/image 10). Enlarged 1.4 cm left paratracheal lymph node (series 3/ image 38), previously 1.2 cm, mildly increased. No additional pathologically enlarged mediastinal or hilar lymph nodes. Lungs/Pleura: No pneumothorax. New trace dependent left pleural effusion. No right pleural effusion. Central left upper lobe 2.8 x 2.4 cm pulmonary nodule (series 4/ image 38), previously 2.6 x 2.2 cm on 03/16/2016 using similar measurement technique and 2.4 x 1.8 cm on 09/01/2015, mildly increased in size. Mosaic attenuation in the left upper lobe suggests mild air trapping. No acute consolidative airspace disease or additional significant pulmonary nodules. Upper abdomen: Diffuse hepatic steatosis. Musculoskeletal: No aggressive appearing focal osseous lesions. Moderate thoracic spondylosis. IMPRESSION: 1. Mild interval growth of central left upper lobe 2.8 cm pulmonary nodule, which was hypermetabolic on 16/96/7893 PET-CT and remains suspicious for a primary bronchogenic carcinoma. 2. Mild interval growth of mild left paratracheal lymphadenopathy,  which was hypermetabolic on 81/04/7508 PET-CT and remain suspicious for ipsilateral mediastinal nodal metastasis. 3. No new sites of metastatic disease in the chest. 4. Stable mild bilateral axillary lymphadenopathy, most likely due to the patient's known low grade follicular lymphoma. 5. New trace dependent left pleural effusion. 6. Stable substernal extension of 3.5 cm lower right thyroid lobe nodule. 7. Aortic atherosclerosis. 8. Diffuse hepatic steatosis. Electronically Signed   By: Ilona Sorrel M.D.   On: 06/15/2016 13:30   Ct Super D Chest Wo Contrast  Result Date: 03/16/2016 CLINICAL DATA:  Followup left hilar mass EXAM: CT CHEST WITHOUT CONTRAST TECHNIQUE: Multidetector CT imaging of the chest was performed using thin slice collimation for electromagnetic bronchoscopy planning purposes, without intravenous contrast. COMPARISON:  12/16/2015 FINDINGS: Cardiovascular: The heart size appears normal. Aortic atherosclerosis noted. Calcification in the LAD coronary artery noted. Left circumflex coronary artery calcification also noted. Mediastinum/Nodes: No pleural fluid. The trachea appears patent and is midline. Normal appearance of the esophagus. Nodule arising from the right lobe of thyroid gland is again noted measuring 3.4 cm, image 21 of series 3. The previous pre-vascular lymph node now measures 8 mm, image 45 of series 3. Previously 1 cm. The previous left hilar mass is difficult to re-evaluate due to lack of IV contrast material. Using the same measurement technique from 12/06/2015 the area of concern measures 1.9 cm, image 41 of series 3. Previously 2 cm. Lungs/Pleura: There is no pleural fluid identified. No atelectasis or pneumonitis. No airspace consolidation. Upper Abdomen: Normal appearance of the adrenal glands. Nonspecific soft tissue stranding within the mesenteric fat is identified. No acute abnormality identified. Musculoskeletal: There is degenerative disc disease identified within the  thoracic spine. No aggressive lytic or sclerotic bone lesions identified. IMPRESSION: 1. Further regression of previously enlarged prevascular lymph node which now measures 8 mm. 2. Difficult to re-evaluate due to lack of IV contrast material, the previously referenced left hilar soft tissue appears stable to decreased in the interval. Electronically Signed   By: Kerby Moors M.D.   On: 03/16/2016 14:01     Ct Super D Chest Wo Contrast  Result Date: 12/16/2015 CLINICAL DATA:  Hilar mass.  Hypertension.  History  of aneurysm. EXAM: CT CHEST WITHOUT CONTRAST TECHNIQUE: Multidetector CT imaging of the chest was performed using thin slice collimation for electromagnetic bronchoscopy planning purposes, without intravenous contrast. COMPARISON:  Multiple exams, including 09/29/2015 FINDINGS: Cardiovascular: Atherosclerotic calcification of the aortic arch. Mild cardiomegaly. Mediastinum/Nodes: Mildly hypodense 3.7 by 2.5 cm right thyroid nodule extends in the upper mediastinum, metabolic activity similar to the rest of the thyroid gland on recent PET-CT. Left suprahilar mass measuring about 2.0 cm in short axis diameter, previously 2.7 cm on 09/01/2015. Partially obscured by surrounding vasculature. Adjacent AP window lymph node 1.0 cm in short axis on image 40/3, previously 1.1 cm. Left lower paratracheal node 1.3 cm in short axis on image 39/3, previously 1.5 cm. The hilar lymph nodes shown previously are less well seen due to the lack of IV contrast. Upper normal size bilateral axillary lymph nodes. Lungs/Pleura: Despite efforts by the technologist and patient, motion artifact is present on today's exam and could not be eliminated. This reduces exam sensitivity and specificity. Left suprahilar mass is noted above. No new pulmonary nodule identified. Upper Abdomen: Unremarkable Musculoskeletal: Thoracic spondylosis with multilevel spurring. IMPRESSION: 1. The left suprahilar mass and adjacent lymph nodes appear  smaller than on the prior exam, but still abnormally enlarged. 2. Hypodense right inferior thyroid nodule, similar to prior exams. Electronically Signed   By: Van Clines M.D.   On: 12/16/2015 14:53    CLINICAL DATA:Initial treatment strategy for mediastinal lymphadenopathy.  EXAM: NUCLEAR MEDICINE PET SKULL BASE TO THIGH  TECHNIQUE: 63 MCi F-18 FDG was injected intravenously. Full-ring PET imaging was performed from the skull base to thigh after the radiotracer. CT data was obtained and used for attenuation correction and anatomic localization.  FASTING BLOOD GLUCOSE:Value: 114 mg/dl  COMPARISON:CT chest 09/01/2015  FINDINGS: NECK  There is bilateral hypermetabolic activity with the thyroid gland. There is nodular enlargement of the RIGHT lobe of thyroid gland to 2.8 by 3.4 cm. This nodule enlargement extends from the lower pole the RIGHT gland into the RIGHT upper paratracheal mediastinum (image 96 series 3). Diffuse metabolic activity favors thyroiditis with goiter.  CHEST  Hypermetabolic soft tissue thickening in the LEFT suprahilar lung / mediastinum measures 2.6 cm (image 117, series 3) with intense metabolic activity (insert SUV max 8.9).  There is a hypermetabolic LEFT lower paratracheal lymph node measures 13 mm with SUV max equal 5.9. No contralateral hypermetabolic lymph nodes. No supraclavicular lymph nodes.  Bilateral mild metabolic activity associated with axial lymph nodes. These lymph nodes have normal morphology. For example peripheral lymph node in the RIGHT axilla with SUV max equal 1.9.  ABDOMEN/PELVIS  No abnormal hypermetabolic activity within the liver, pancreas, adrenal glands, or spleen.  Hypermetabolic lymph node in the medial LEFT inguinal region with measuring 21 mm short axis node on image 338, series 3 with SUV max equal 7.0.  Within the central mesentery, prominent lymph nodes surrounded by slight haziness to the  mesentery (image 221, series 3). Mild metabolic activity associated with these prominent lymph nodes (SUV max of 3.2).  Spleen is normal.  SKELETON  No focal hypermetabolic activity to suggest skeletal metastasis.  IMPRESSION: 1. Hypermetabolic LEFT suprahilar mass consists primary bronchogenic carcinoma. 2. Ipsilateral nodal metastasis to the LEFT lower paratracheal nodal station. 3. No evidence of distant metastatic consistent bronchogenic carcinoma. 4. Bilateral mildly hypermetabolic axillary lymph nodes. Prominent central mesenteric adenopathy with hazy mesentery pattern. Intensely hypermetabolic LEFT inguinal lymph node. Overall the findings raise the concern for low-grade lymphoproliferative process. Recommend  biopsy of the most hypermetabolic lesion (medial LEFT inguinal lymph node).   Electronically Signed ByOswald Hillock M.D. On: 09/29/2015 14:05 I have independently reviewed the above radiologic studies.  Recent Lab Findings: Lab Results  Component Value Date   WBC 10.2 11/15/2015   HGB 16.6 11/15/2015   HCT 49.6 11/15/2015   PLT 181 11/15/2015   GLUCOSE 137 (H) 11/15/2015   ALT 50 11/15/2015   AST 29 11/15/2015   NA 138 11/15/2015   K 4.3 11/15/2015   CL 108 11/15/2015   CREATININE 1.19 11/15/2015   BUN 15 11/15/2015   CO2 23 11/15/2015   INR 1.10 11/15/2015      Assessment / Plan:   History of saddle pulmonary embolus March 2017 History of intermittent atrial fibrillation Lifelong non-smoker Previous diagnosis of follicular cell low-grade lymphoma by needle biopsy of the left groin. Slowly enlarging diffuse adenopathy throughout this CT scan of the chest Previous left hilar lung biopsy negative on bronchoscopy and ebus The patient continues to see medical oncology being observed with diagnosis of low-grade lymphoma  The nodal disease in the chest appears stable to very slightly enlarged lymph nodes.    Plan follow-up CT scan chest in 8  months, can coordinate with scans done by oncology  Grace Isaac MD      [address] [place] [address] Office [phone]   Beeper [phone]  10/31/2017 4:20 PM

## 2017-11-02 DIAGNOSIS — G4733 Obstructive sleep apnea (adult) (pediatric): Secondary | ICD-10-CM | POA: Diagnosis not present

## 2017-11-08 DIAGNOSIS — F411 Generalized anxiety disorder: Secondary | ICD-10-CM | POA: Diagnosis not present

## 2017-11-08 DIAGNOSIS — F330 Major depressive disorder, recurrent, mild: Secondary | ICD-10-CM | POA: Diagnosis not present

## 2017-11-13 DIAGNOSIS — H524 Presbyopia: Secondary | ICD-10-CM | POA: Diagnosis not present

## 2017-11-13 DIAGNOSIS — H2513 Age-related nuclear cataract, bilateral: Secondary | ICD-10-CM | POA: Diagnosis not present

## 2017-11-13 DIAGNOSIS — H40052 Ocular hypertension, left eye: Secondary | ICD-10-CM | POA: Diagnosis not present

## 2017-11-26 DIAGNOSIS — G4733 Obstructive sleep apnea (adult) (pediatric): Secondary | ICD-10-CM | POA: Diagnosis not present

## 2017-11-26 DIAGNOSIS — C61 Malignant neoplasm of prostate: Secondary | ICD-10-CM | POA: Diagnosis not present

## 2017-11-27 ENCOUNTER — Encounter: Payer: Self-pay | Admitting: Cardiology

## 2017-11-27 ENCOUNTER — Ambulatory Visit (INDEPENDENT_AMBULATORY_CARE_PROVIDER_SITE_OTHER): Payer: PPO | Admitting: Cardiology

## 2017-11-27 VITALS — BP 150/82 | HR 74 | Ht 74.0 in | Wt 273.0 lb

## 2017-11-27 DIAGNOSIS — I4892 Unspecified atrial flutter: Secondary | ICD-10-CM | POA: Diagnosis not present

## 2017-11-27 DIAGNOSIS — I2602 Saddle embolus of pulmonary artery with acute cor pulmonale: Secondary | ICD-10-CM

## 2017-11-27 DIAGNOSIS — Z7901 Long term (current) use of anticoagulants: Secondary | ICD-10-CM

## 2017-11-27 DIAGNOSIS — Z86711 Personal history of pulmonary embolism: Secondary | ICD-10-CM | POA: Diagnosis not present

## 2017-11-27 NOTE — Patient Instructions (Signed)

## 2017-11-27 NOTE — Progress Notes (Signed)
Cardiology Office Note:    Date:  11/27/2017   ID:  Edward Johnston, DOB Feb 27, 1951, MRN [id number]  PCP:  Angelina Sheriff, MD  Cardiologist:  Jenean Lindau, MD   Referring MD: Angelina Sheriff, MD    ASSESSMENT:    1. Acute saddle pulmonary embolism with acute cor pulmonale (HCC)   2. Atrial flutter, paroxysmal ([place])   3. History of pulmonary embolism   4. Current use of long term anticoagulation    PLAN:    In order of problems listed above:  1. Has stable rhythm at this time and is continued on anticoagulation and tolerating it well.  Benefits and potential risks were explained to the patient and he vocalized understanding.  His blood work is followed by his primary care physician. 2. Patient will be seen in follow-up appointment in 6 months or earlier if the patient has any concerns    Medication Adjustments/Labs and Tests Ordered: Current medicines are reviewed at length with the patient today.  Concerns regarding medicines are outlined above.  No orders of the defined types were placed in this encounter.  No orders of the defined types were placed in this encounter.    No chief complaint on file.    History of Present Illness:    Edward Johnston is a 67 y.o. male.  Patient has atrial flutter and recently diagnosed to have a saddle embolism.  Patient has been on anticoagulation with Eliquis 5 mg twice a day and tolerating it well.  He denies any chest pain orthopnea or PND.  Past Medical History:  Diagnosis Date  . Anxiety   . Complication of anesthesia   . Depression   . Dysrhythmia   . Hypertension    due to psych med  . Incontinence   . Persistent atrial fibrillation ([place])   . PONV (postoperative nausea and vomiting)   . Shortness of breath dyspnea     Past Surgical History:  Procedure Laterality Date  .  aneurysm clip    . BRAIN SURGERY  1989   aneurysm clipping- [facility]  . JOINT REPLACEMENT    . LYMPH NODE BIOPSY N/A 11/16/2015   Procedure: Biopsy of node 7,4L and 10L;  Surgeon: Grace Isaac, MD;  Location: [facility];  Service: Thoracic;  Laterality: N/A;  . PROSTATECTOMY    . REPLACEMENT TOTAL KNEE    . VIDEO BRONCHOSCOPY WITH ENDOBRONCHIAL ULTRASOUND N/A 11/16/2015   Procedure: VIDEO BRONCHOSCOPY WITH ENDOBRONCHIAL ULTRASOUND;  Surgeon: Grace Isaac, MD;  Location: [facility];  Service: Thoracic;  Laterality: N/A;    Current Medications: Current Meds  Medication Sig  . apixaban (ELIQUIS) 5 MG TABS tablet Take 5 mg by mouth 2 (two) times daily.  . ARIPiprazole (ABILIFY) 2 MG tablet Take 1 mg by mouth at bedtime.   Marland Kitchen aspirin EC 81 MG tablet Take 81 mg by mouth daily.  Marland Kitchen buPROPion (WELLBUTRIN XL) 300 MG 24 hr tablet Take 300 mg by mouth daily.   . clorazepate (TRANXENE) 7.5 MG tablet Take 3.75 mg by mouth 2 (two) times daily as needed for anxiety.   . clotrimazole-betamethasone (LOTRISONE) cream Apply 1 application topically as needed.  . latanoprost (XALATAN) 0.005 % ophthalmic solution Place 1 drop into the left eye at bedtime.  . metoprolol succinate (TOPROL-XL) 100 MG 24 hr tablet Take 1 tablet (100 mg total) by mouth daily. Take with or immediately following a meal.  . omeprazole (PRILOSEC) 20 MG capsule Take 20 mg by  mouth daily.  Marland Kitchen terbinafine (LAMISIL) 250 MG tablet Take 250 mg by mouth daily.  . Vilazodone HCl (VIIBRYD) 20 MG TABS Take 20 mg by mouth daily.      Allergies:   Digoxin   Social History   Socioeconomic History  . Marital status: Married    Spouse name: Not on file  . Number of children: Not on file  . Years of education: Not on file  . Highest education level: Not on file  Occupational History  . Not on file  Social Needs  . Financial resource strain: Not on file  . Food insecurity:    Worry: Not on file    Inability: Not on file  . Transportation needs:    Medical: Not on file    Non-medical: Not on file  Tobacco Use  . Smoking status: Never Smoker  . Smokeless tobacco: Never  Used  Substance and Sexual Activity  . Alcohol use: No  . Drug use: No  . Sexual activity: Not Currently  Lifestyle  . Physical activity:    Days per week: Not on file    Minutes per session: Not on file  . Stress: Not on file  Relationships  . Social connections:    Talks on phone: Not on file    Gets together: Not on file    Attends religious service: Not on file    Active member of club or organization: Not on file    Attends meetings of clubs or organizations: Not on file    Relationship status: Not on file  Other Topics Concern  . Not on file  Social History Narrative  . Not on file     Family History: The patient's family history includes Depression in his sister.  ROS:   Please see the history of present illness.    All other systems reviewed and are negative.  EKGs/Labs/Other Studies Reviewed:    The following studies were reviewed today: I reviewed hospital records extensively and discussed findings with the patient.   Recent Labs: No results found for requested labs within last 8760 hours.  Recent Lipid Panel No results found for: CHOL, TRIG, HDL, CHOLHDL, VLDL, LDLCALC, LDLDIRECT  Physical Exam:    VS:  BP (!) 150/82 (BP Location: Right Arm, Patient Position: Sitting, Cuff Size: Normal)   Pulse 74   Ht 6\' 2"  (1.88 m)   Wt 273 lb (123.8 kg)   SpO2 97%   BMI 35.05 kg/m     Wt Readings from Last 3 Encounters:  11/27/17 273 lb (123.8 kg)  10/31/17 279 lb (126.6 kg)  09/20/17 279 lb (126.6 kg)     GEN: Patient is in no acute distress HEENT: Normal NECK: No JVD; No carotid bruits LYMPHATICS: No lymphadenopathy CARDIAC: Hear sounds regular, 2/6 systolic murmur at the apex. RESPIRATORY:  Clear to auscultation without rales, wheezing or rhonchi  ABDOMEN: Soft, non-tender, non-distended MUSCULOSKELETAL:  No edema; No deformity  SKIN: Warm and dry NEUROLOGIC:  Alert and oriented x 3 PSYCHIATRIC:  Normal affect   Signed, Jenean Lindau, MD    11/27/2017 11:51 AM    [place]

## 2017-11-29 DIAGNOSIS — D510 Vitamin B12 deficiency anemia due to intrinsic factor deficiency: Secondary | ICD-10-CM | POA: Diagnosis not present

## 2017-12-11 DIAGNOSIS — L821 Other seborrheic keratosis: Secondary | ICD-10-CM | POA: Diagnosis not present

## 2017-12-11 DIAGNOSIS — B351 Tinea unguium: Secondary | ICD-10-CM | POA: Diagnosis not present

## 2017-12-13 DIAGNOSIS — G4733 Obstructive sleep apnea (adult) (pediatric): Secondary | ICD-10-CM | POA: Diagnosis not present

## 2017-12-25 DIAGNOSIS — R0982 Postnasal drip: Secondary | ICD-10-CM | POA: Diagnosis not present

## 2017-12-25 DIAGNOSIS — R918 Other nonspecific abnormal finding of lung field: Secondary | ICD-10-CM | POA: Diagnosis not present

## 2017-12-25 DIAGNOSIS — G4733 Obstructive sleep apnea (adult) (pediatric): Secondary | ICD-10-CM | POA: Diagnosis not present

## 2017-12-31 DIAGNOSIS — D510 Vitamin B12 deficiency anemia due to intrinsic factor deficiency: Secondary | ICD-10-CM | POA: Diagnosis not present

## 2018-01-04 DIAGNOSIS — Z23 Encounter for immunization: Secondary | ICD-10-CM | POA: Diagnosis not present

## 2018-01-16 DIAGNOSIS — G4733 Obstructive sleep apnea (adult) (pediatric): Secondary | ICD-10-CM | POA: Diagnosis not present

## 2018-01-30 DIAGNOSIS — D510 Vitamin B12 deficiency anemia due to intrinsic factor deficiency: Secondary | ICD-10-CM | POA: Diagnosis not present

## 2018-02-04 DIAGNOSIS — C8305 Small cell B-cell lymphoma, lymph nodes of inguinal region and lower limb: Secondary | ICD-10-CM | POA: Diagnosis not present

## 2018-02-04 DIAGNOSIS — C8590 Non-Hodgkin lymphoma, unspecified, unspecified site: Secondary | ICD-10-CM | POA: Diagnosis not present

## 2018-02-08 DIAGNOSIS — C3490 Malignant neoplasm of unspecified part of unspecified bronchus or lung: Secondary | ICD-10-CM | POA: Diagnosis not present

## 2018-02-08 DIAGNOSIS — C8305 Small cell B-cell lymphoma, lymph nodes of inguinal region and lower limb: Secondary | ICD-10-CM | POA: Diagnosis not present

## 2018-02-08 DIAGNOSIS — C61 Malignant neoplasm of prostate: Secondary | ICD-10-CM | POA: Diagnosis not present

## 2018-02-08 DIAGNOSIS — R918 Other nonspecific abnormal finding of lung field: Secondary | ICD-10-CM | POA: Diagnosis not present

## 2018-02-20 DIAGNOSIS — C3492 Malignant neoplasm of unspecified part of left bronchus or lung: Secondary | ICD-10-CM | POA: Diagnosis not present

## 2018-02-20 DIAGNOSIS — I700 Atherosclerosis of aorta: Secondary | ICD-10-CM | POA: Diagnosis not present

## 2018-02-20 DIAGNOSIS — R590 Localized enlarged lymph nodes: Secondary | ICD-10-CM | POA: Diagnosis not present

## 2018-02-20 DIAGNOSIS — R918 Other nonspecific abnormal finding of lung field: Secondary | ICD-10-CM | POA: Diagnosis not present

## 2018-02-20 DIAGNOSIS — C8595 Non-Hodgkin lymphoma, unspecified, lymph nodes of inguinal region and lower limb: Secondary | ICD-10-CM | POA: Diagnosis not present

## 2018-02-20 DIAGNOSIS — E041 Nontoxic single thyroid nodule: Secondary | ICD-10-CM | POA: Diagnosis not present

## 2018-02-21 DIAGNOSIS — C8305 Small cell B-cell lymphoma, lymph nodes of inguinal region and lower limb: Secondary | ICD-10-CM | POA: Diagnosis not present

## 2018-02-26 DIAGNOSIS — D510 Vitamin B12 deficiency anemia due to intrinsic factor deficiency: Secondary | ICD-10-CM | POA: Diagnosis not present

## 2018-03-04 DIAGNOSIS — Z01810 Encounter for preprocedural cardiovascular examination: Secondary | ICD-10-CM | POA: Diagnosis not present

## 2018-03-04 DIAGNOSIS — Z01812 Encounter for preprocedural laboratory examination: Secondary | ICD-10-CM | POA: Diagnosis not present

## 2018-03-04 DIAGNOSIS — C8305 Small cell B-cell lymphoma, lymph nodes of inguinal region and lower limb: Secondary | ICD-10-CM | POA: Diagnosis not present

## 2018-03-04 DIAGNOSIS — I4891 Unspecified atrial fibrillation: Secondary | ICD-10-CM | POA: Diagnosis not present

## 2018-03-06 DIAGNOSIS — R599 Enlarged lymph nodes, unspecified: Secondary | ICD-10-CM | POA: Diagnosis not present

## 2018-03-06 DIAGNOSIS — D152 Benign neoplasm of mediastinum: Secondary | ICD-10-CM | POA: Diagnosis not present

## 2018-03-06 DIAGNOSIS — C8305 Small cell B-cell lymphoma, lymph nodes of inguinal region and lower limb: Secondary | ICD-10-CM | POA: Diagnosis not present

## 2018-03-06 DIAGNOSIS — R918 Other nonspecific abnormal finding of lung field: Secondary | ICD-10-CM | POA: Diagnosis not present

## 2018-03-06 DIAGNOSIS — R590 Localized enlarged lymph nodes: Secondary | ICD-10-CM | POA: Diagnosis not present

## 2018-03-09 DIAGNOSIS — I4891 Unspecified atrial fibrillation: Secondary | ICD-10-CM | POA: Diagnosis not present

## 2018-03-09 DIAGNOSIS — R000 Tachycardia, unspecified: Secondary | ICD-10-CM | POA: Diagnosis not present

## 2018-03-20 DIAGNOSIS — C8305 Small cell B-cell lymphoma, lymph nodes of inguinal region and lower limb: Secondary | ICD-10-CM | POA: Diagnosis not present

## 2018-03-20 DIAGNOSIS — E041 Nontoxic single thyroid nodule: Secondary | ICD-10-CM | POA: Diagnosis not present

## 2018-03-21 DIAGNOSIS — G4733 Obstructive sleep apnea (adult) (pediatric): Secondary | ICD-10-CM | POA: Diagnosis not present

## 2018-03-29 DIAGNOSIS — Z20828 Contact with and (suspected) exposure to other viral communicable diseases: Secondary | ICD-10-CM | POA: Diagnosis not present

## 2018-04-08 DIAGNOSIS — D510 Vitamin B12 deficiency anemia due to intrinsic factor deficiency: Secondary | ICD-10-CM | POA: Diagnosis not present

## 2018-04-16 DIAGNOSIS — G4733 Obstructive sleep apnea (adult) (pediatric): Secondary | ICD-10-CM | POA: Diagnosis not present

## 2018-04-16 DIAGNOSIS — R0982 Postnasal drip: Secondary | ICD-10-CM | POA: Diagnosis not present

## 2018-04-16 DIAGNOSIS — R918 Other nonspecific abnormal finding of lung field: Secondary | ICD-10-CM | POA: Diagnosis not present

## 2018-04-16 IMAGING — CR DG CHEST 1V PORT
1 series · 1 of 1 positions shown · non-contrast
Comparison: 11/15/2015 .

CLINICAL DATA: Bronchoscopy with biopsy.

EXAM:
PORTABLE CHEST 1 VIEW

[portable]
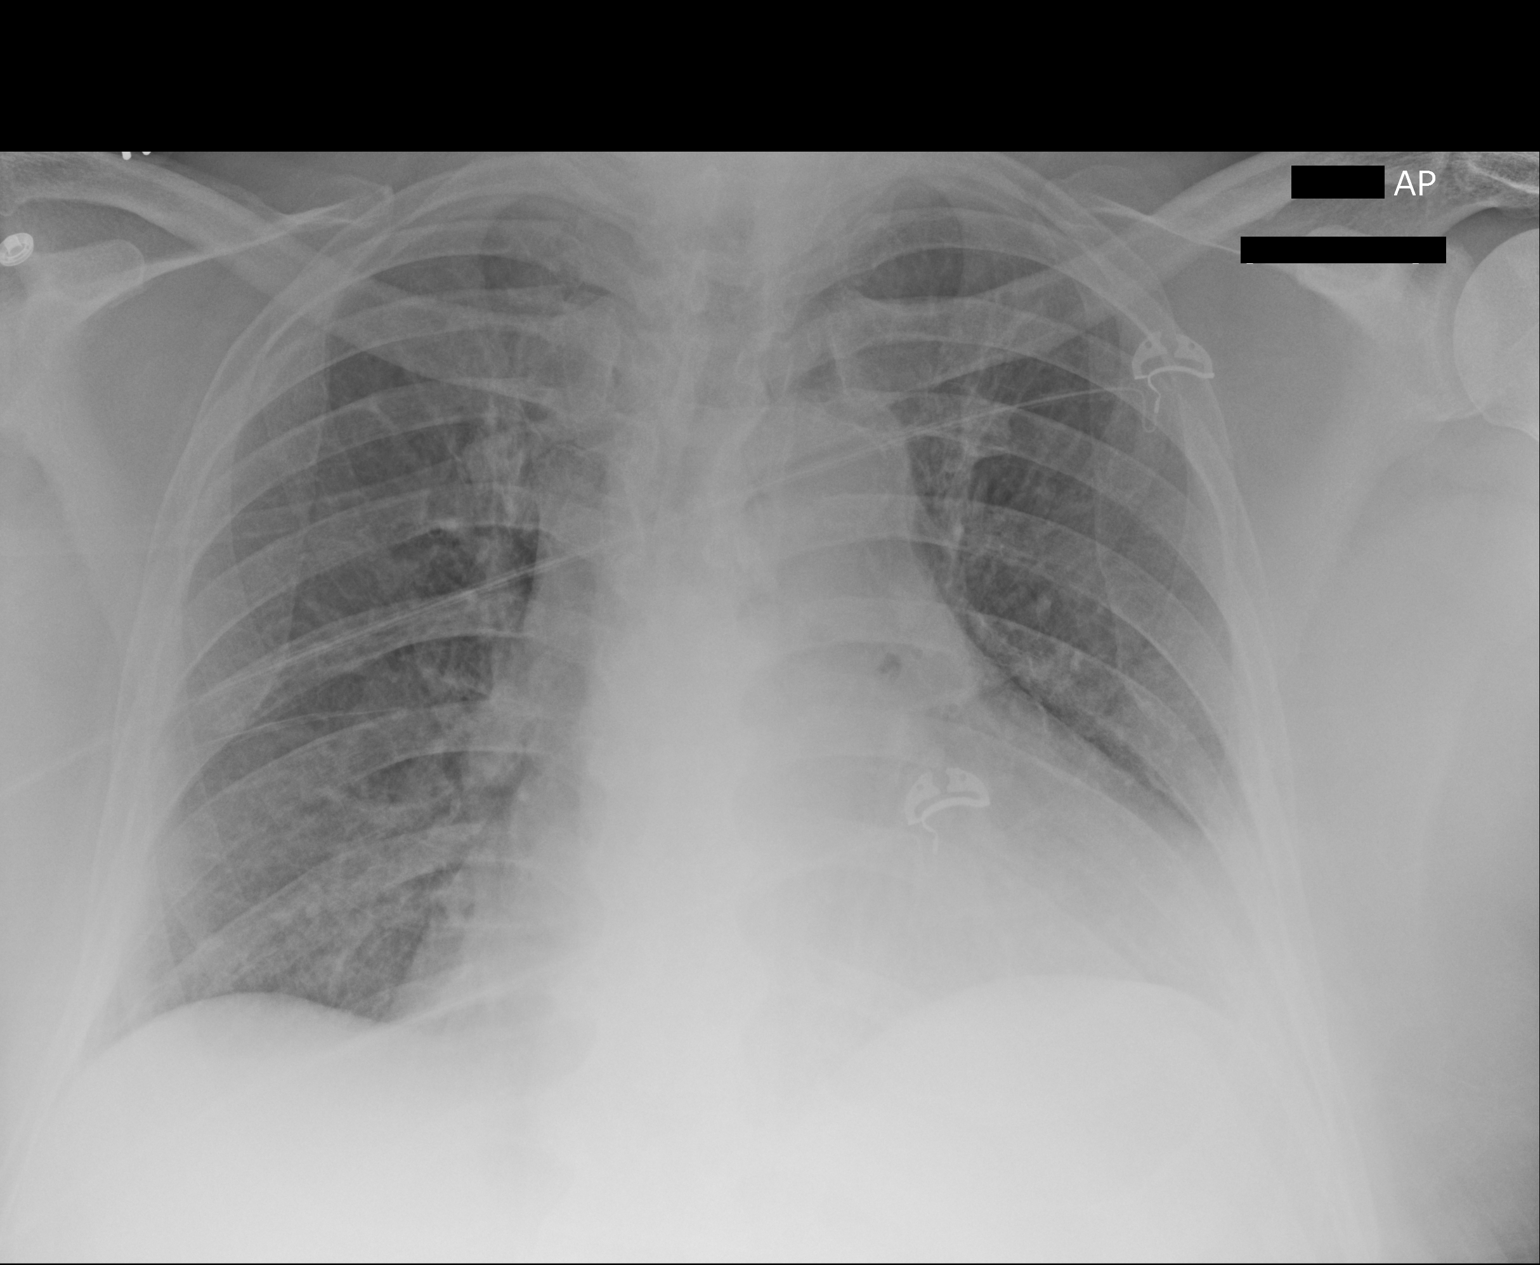

[1 of 1 positions shown; findings below may reference images not displayed]

FINDINGS: Cardiomegaly with mild bilateral interstitial prominence. A mild
component congestive heart failure cannot be excluded. Low lung
volumes with basilar atelectasis and/or infiltrates. Small left
pleural effusion cannot be excluded. No pneumothorax.
IMPRESSION: 1. Cardiomegaly with mild bilateral interstitial prominence. Mild
congestive heart failure cannot be excluded.
2. Low lung volumes with mild bibasilar atelectasis and/or
infiltrates. Small left pleural effusion cannot be excluded. No
pneumothorax.

## 2018-04-25 DIAGNOSIS — G4733 Obstructive sleep apnea (adult) (pediatric): Secondary | ICD-10-CM | POA: Diagnosis not present

## 2018-05-02 ENCOUNTER — Other Ambulatory Visit: Payer: Self-pay | Admitting: *Deleted

## 2018-05-02 DIAGNOSIS — D510 Vitamin B12 deficiency anemia due to intrinsic factor deficiency: Secondary | ICD-10-CM | POA: Diagnosis not present

## 2018-05-02 DIAGNOSIS — J9859 Other diseases of mediastinum, not elsewhere classified: Secondary | ICD-10-CM

## 2018-05-13 ENCOUNTER — Other Ambulatory Visit: Payer: Self-pay | Admitting: Cardiology

## 2018-05-16 DIAGNOSIS — H40052 Ocular hypertension, left eye: Secondary | ICD-10-CM | POA: Diagnosis not present

## 2018-05-27 DIAGNOSIS — G4733 Obstructive sleep apnea (adult) (pediatric): Secondary | ICD-10-CM | POA: Diagnosis not present

## 2018-06-04 DIAGNOSIS — D519 Vitamin B12 deficiency anemia, unspecified: Secondary | ICD-10-CM | POA: Diagnosis not present

## 2018-06-12 DIAGNOSIS — B351 Tinea unguium: Secondary | ICD-10-CM | POA: Diagnosis not present

## 2018-06-12 DIAGNOSIS — L570 Actinic keratosis: Secondary | ICD-10-CM | POA: Diagnosis not present

## 2018-06-12 DIAGNOSIS — L820 Inflamed seborrheic keratosis: Secondary | ICD-10-CM | POA: Diagnosis not present

## 2018-06-13 ENCOUNTER — Ambulatory Visit: Payer: Self-pay | Admitting: Cardiothoracic Surgery

## 2018-06-13 ENCOUNTER — Other Ambulatory Visit: Payer: Self-pay

## 2018-06-27 DIAGNOSIS — F411 Generalized anxiety disorder: Secondary | ICD-10-CM | POA: Diagnosis not present

## 2018-06-27 DIAGNOSIS — F330 Major depressive disorder, recurrent, mild: Secondary | ICD-10-CM | POA: Diagnosis not present

## 2018-08-09 DIAGNOSIS — G4733 Obstructive sleep apnea (adult) (pediatric): Secondary | ICD-10-CM | POA: Diagnosis not present

## 2018-08-10 ENCOUNTER — Other Ambulatory Visit: Payer: Self-pay | Admitting: Cardiology

## 2018-08-12 NOTE — Telephone Encounter (Signed)
Rx refill sent to pharmacy. 

## 2018-08-13 DIAGNOSIS — D510 Vitamin B12 deficiency anemia due to intrinsic factor deficiency: Secondary | ICD-10-CM | POA: Diagnosis not present

## 2018-09-09 DIAGNOSIS — E538 Deficiency of other specified B group vitamins: Secondary | ICD-10-CM | POA: Diagnosis not present

## 2018-09-16 DIAGNOSIS — R7309 Other abnormal glucose: Secondary | ICD-10-CM | POA: Diagnosis not present

## 2018-09-16 DIAGNOSIS — C8305 Small cell B-cell lymphoma, lymph nodes of inguinal region and lower limb: Secondary | ICD-10-CM | POA: Diagnosis not present

## 2018-09-16 DIAGNOSIS — R918 Other nonspecific abnormal finding of lung field: Secondary | ICD-10-CM | POA: Diagnosis not present

## 2018-09-16 DIAGNOSIS — I700 Atherosclerosis of aorta: Secondary | ICD-10-CM | POA: Diagnosis not present

## 2018-09-16 DIAGNOSIS — C8510 Unspecified B-cell lymphoma, unspecified site: Secondary | ICD-10-CM | POA: Diagnosis not present

## 2018-09-18 DIAGNOSIS — G4733 Obstructive sleep apnea (adult) (pediatric): Secondary | ICD-10-CM | POA: Diagnosis not present

## 2018-09-18 DIAGNOSIS — C8305 Small cell B-cell lymphoma, lymph nodes of inguinal region and lower limb: Secondary | ICD-10-CM | POA: Diagnosis not present

## 2018-09-30 DIAGNOSIS — I4892 Unspecified atrial flutter: Secondary | ICD-10-CM | POA: Diagnosis not present

## 2018-09-30 DIAGNOSIS — Z6837 Body mass index (BMI) 37.0-37.9, adult: Secondary | ICD-10-CM | POA: Diagnosis not present

## 2018-09-30 DIAGNOSIS — D519 Vitamin B12 deficiency anemia, unspecified: Secondary | ICD-10-CM | POA: Diagnosis not present

## 2018-09-30 DIAGNOSIS — M8580 Other specified disorders of bone density and structure, unspecified site: Secondary | ICD-10-CM | POA: Diagnosis not present

## 2018-09-30 DIAGNOSIS — Z79899 Other long term (current) drug therapy: Secondary | ICD-10-CM | POA: Diagnosis not present

## 2018-09-30 DIAGNOSIS — K760 Fatty (change of) liver, not elsewhere classified: Secondary | ICD-10-CM | POA: Diagnosis not present

## 2018-09-30 DIAGNOSIS — D510 Vitamin B12 deficiency anemia due to intrinsic factor deficiency: Secondary | ICD-10-CM | POA: Diagnosis not present

## 2018-09-30 DIAGNOSIS — Z0000 Encounter for general adult medical examination without abnormal findings: Secondary | ICD-10-CM | POA: Diagnosis not present

## 2018-09-30 DIAGNOSIS — G4733 Obstructive sleep apnea (adult) (pediatric): Secondary | ICD-10-CM | POA: Diagnosis not present

## 2018-09-30 DIAGNOSIS — E785 Hyperlipidemia, unspecified: Secondary | ICD-10-CM | POA: Diagnosis not present

## 2018-09-30 DIAGNOSIS — M1990 Unspecified osteoarthritis, unspecified site: Secondary | ICD-10-CM | POA: Diagnosis not present

## 2018-09-30 DIAGNOSIS — Z8546 Personal history of malignant neoplasm of prostate: Secondary | ICD-10-CM | POA: Diagnosis not present

## 2018-10-12 DIAGNOSIS — J181 Lobar pneumonia, unspecified organism: Secondary | ICD-10-CM | POA: Diagnosis not present

## 2018-10-12 DIAGNOSIS — Z7901 Long term (current) use of anticoagulants: Secondary | ICD-10-CM | POA: Diagnosis not present

## 2018-10-12 DIAGNOSIS — R0689 Other abnormalities of breathing: Secondary | ICD-10-CM | POA: Diagnosis not present

## 2018-10-12 DIAGNOSIS — I351 Nonrheumatic aortic (valve) insufficiency: Secondary | ICD-10-CM | POA: Diagnosis not present

## 2018-10-12 DIAGNOSIS — I4891 Unspecified atrial fibrillation: Secondary | ICD-10-CM | POA: Diagnosis not present

## 2018-10-12 DIAGNOSIS — R5381 Other malaise: Secondary | ICD-10-CM | POA: Diagnosis not present

## 2018-10-12 DIAGNOSIS — R0902 Hypoxemia: Secondary | ICD-10-CM | POA: Diagnosis not present

## 2018-10-12 DIAGNOSIS — R0602 Shortness of breath: Secondary | ICD-10-CM | POA: Diagnosis not present

## 2018-10-12 DIAGNOSIS — R0600 Dyspnea, unspecified: Secondary | ICD-10-CM | POA: Diagnosis not present

## 2018-10-12 DIAGNOSIS — J129 Viral pneumonia, unspecified: Secondary | ICD-10-CM | POA: Diagnosis not present

## 2018-10-12 DIAGNOSIS — I10 Essential (primary) hypertension: Secondary | ICD-10-CM | POA: Diagnosis not present

## 2018-10-12 DIAGNOSIS — I517 Cardiomegaly: Secondary | ICD-10-CM | POA: Diagnosis not present

## 2018-10-12 DIAGNOSIS — Z9181 History of falling: Secondary | ICD-10-CM | POA: Diagnosis not present

## 2018-10-12 DIAGNOSIS — J9601 Acute respiratory failure with hypoxia: Secondary | ICD-10-CM | POA: Diagnosis not present

## 2018-10-12 DIAGNOSIS — I272 Pulmonary hypertension, unspecified: Secondary | ICD-10-CM | POA: Diagnosis not present

## 2018-10-12 DIAGNOSIS — F418 Other specified anxiety disorders: Secondary | ICD-10-CM | POA: Diagnosis not present

## 2018-10-12 DIAGNOSIS — R509 Fever, unspecified: Secondary | ICD-10-CM | POA: Diagnosis not present

## 2018-10-12 DIAGNOSIS — J189 Pneumonia, unspecified organism: Secondary | ICD-10-CM | POA: Diagnosis not present

## 2018-10-12 DIAGNOSIS — Z86711 Personal history of pulmonary embolism: Secondary | ICD-10-CM | POA: Diagnosis not present

## 2018-10-12 DIAGNOSIS — A419 Sepsis, unspecified organism: Secondary | ICD-10-CM | POA: Diagnosis not present

## 2018-10-12 DIAGNOSIS — R079 Chest pain, unspecified: Secondary | ICD-10-CM | POA: Diagnosis not present

## 2018-10-12 DIAGNOSIS — I482 Chronic atrial fibrillation, unspecified: Secondary | ICD-10-CM | POA: Diagnosis not present

## 2018-10-12 DIAGNOSIS — J984 Other disorders of lung: Secondary | ICD-10-CM | POA: Diagnosis not present

## 2018-10-12 DIAGNOSIS — Z7982 Long term (current) use of aspirin: Secondary | ICD-10-CM | POA: Diagnosis not present

## 2018-10-12 DIAGNOSIS — Z79899 Other long term (current) drug therapy: Secondary | ICD-10-CM | POA: Diagnosis not present

## 2018-10-12 DIAGNOSIS — I499 Cardiac arrhythmia, unspecified: Secondary | ICD-10-CM | POA: Diagnosis not present

## 2018-10-12 DIAGNOSIS — R918 Other nonspecific abnormal finding of lung field: Secondary | ICD-10-CM | POA: Diagnosis not present

## 2018-10-14 DIAGNOSIS — F418 Other specified anxiety disorders: Secondary | ICD-10-CM

## 2018-10-14 DIAGNOSIS — J9601 Acute respiratory failure with hypoxia: Secondary | ICD-10-CM

## 2018-10-20 DIAGNOSIS — I482 Chronic atrial fibrillation, unspecified: Secondary | ICD-10-CM | POA: Diagnosis not present

## 2018-10-20 DIAGNOSIS — F419 Anxiety disorder, unspecified: Secondary | ICD-10-CM | POA: Diagnosis not present

## 2018-10-20 DIAGNOSIS — Z86711 Personal history of pulmonary embolism: Secondary | ICD-10-CM | POA: Diagnosis not present

## 2018-10-20 DIAGNOSIS — J9601 Acute respiratory failure with hypoxia: Secondary | ICD-10-CM | POA: Diagnosis not present

## 2018-10-20 DIAGNOSIS — F329 Major depressive disorder, single episode, unspecified: Secondary | ICD-10-CM | POA: Diagnosis not present

## 2018-10-20 DIAGNOSIS — I10 Essential (primary) hypertension: Secondary | ICD-10-CM | POA: Diagnosis not present

## 2018-10-20 DIAGNOSIS — Z7901 Long term (current) use of anticoagulants: Secondary | ICD-10-CM | POA: Diagnosis not present

## 2018-10-20 DIAGNOSIS — M1991 Primary osteoarthritis, unspecified site: Secondary | ICD-10-CM | POA: Diagnosis not present

## 2018-10-20 DIAGNOSIS — I4892 Unspecified atrial flutter: Secondary | ICD-10-CM | POA: Diagnosis not present

## 2018-10-20 DIAGNOSIS — Z7982 Long term (current) use of aspirin: Secondary | ICD-10-CM | POA: Diagnosis not present

## 2018-10-20 DIAGNOSIS — J189 Pneumonia, unspecified organism: Secondary | ICD-10-CM | POA: Diagnosis not present

## 2018-10-22 DIAGNOSIS — Z9181 History of falling: Secondary | ICD-10-CM | POA: Diagnosis not present

## 2018-10-22 DIAGNOSIS — G4733 Obstructive sleep apnea (adult) (pediatric): Secondary | ICD-10-CM | POA: Diagnosis not present

## 2018-10-22 DIAGNOSIS — I4891 Unspecified atrial fibrillation: Secondary | ICD-10-CM | POA: Diagnosis not present

## 2018-10-22 DIAGNOSIS — Z6836 Body mass index (BMI) 36.0-36.9, adult: Secondary | ICD-10-CM | POA: Diagnosis not present

## 2018-10-22 DIAGNOSIS — J189 Pneumonia, unspecified organism: Secondary | ICD-10-CM | POA: Diagnosis not present

## 2018-10-23 DIAGNOSIS — M7712 Lateral epicondylitis, left elbow: Secondary | ICD-10-CM | POA: Diagnosis not present

## 2018-10-23 DIAGNOSIS — M7711 Lateral epicondylitis, right elbow: Secondary | ICD-10-CM | POA: Diagnosis not present

## 2018-10-31 ENCOUNTER — Other Ambulatory Visit: Payer: Self-pay

## 2018-11-04 ENCOUNTER — Other Ambulatory Visit: Payer: Self-pay | Admitting: Cardiology

## 2018-11-04 DIAGNOSIS — J189 Pneumonia, unspecified organism: Secondary | ICD-10-CM | POA: Diagnosis not present

## 2018-11-04 DIAGNOSIS — D519 Vitamin B12 deficiency anemia, unspecified: Secondary | ICD-10-CM | POA: Diagnosis not present

## 2018-11-05 DIAGNOSIS — J189 Pneumonia, unspecified organism: Secondary | ICD-10-CM | POA: Diagnosis not present

## 2018-11-06 DIAGNOSIS — J189 Pneumonia, unspecified organism: Secondary | ICD-10-CM | POA: Diagnosis not present

## 2018-11-13 ENCOUNTER — Other Ambulatory Visit: Payer: Self-pay

## 2018-11-13 ENCOUNTER — Encounter: Payer: Self-pay | Admitting: Cardiology

## 2018-11-13 ENCOUNTER — Ambulatory Visit (INDEPENDENT_AMBULATORY_CARE_PROVIDER_SITE_OTHER): Payer: PPO | Admitting: Cardiology

## 2018-11-13 VITALS — BP 138/74 | HR 93 | Temp 97.9°F | Ht 72.0 in | Wt 274.0 lb

## 2018-11-13 DIAGNOSIS — Z86711 Personal history of pulmonary embolism: Secondary | ICD-10-CM | POA: Diagnosis not present

## 2018-11-13 DIAGNOSIS — I4821 Permanent atrial fibrillation: Secondary | ICD-10-CM

## 2018-11-13 DIAGNOSIS — I4892 Unspecified atrial flutter: Secondary | ICD-10-CM | POA: Diagnosis not present

## 2018-11-13 DIAGNOSIS — G4730 Sleep apnea, unspecified: Secondary | ICD-10-CM

## 2018-11-13 DIAGNOSIS — Z7901 Long term (current) use of anticoagulants: Secondary | ICD-10-CM | POA: Diagnosis not present

## 2018-11-13 MED ORDER — DILTIAZEM HCL ER BEADS 300 MG PO CP24: 300.0000 mg | ORAL_CAPSULE | Freq: Every day | ORAL | 2 refills | Status: DC

## 2018-11-13 NOTE — Progress Notes (Signed)
Cardiology Office Note:    Date:  11/13/2018   ID:  Edward Johnston, DOB 23-Sep-1950, MRN [id number]  PCP:  Angelina Sheriff, MD  Cardiologist:  Jenean Lindau, MD   Referring MD: Angelina Sheriff, MD    ASSESSMENT:    1. Permanent atrial fibrillation   2. Atrial flutter, paroxysmal ([place])   3. Sleep apnea, unspecified type   4. History of pulmonary embolism   5. Current use of long term anticoagulation    PLAN:    In order of problems listed above:  1. Atrial fibrillation, permanent:I discussed with the patient atrial fibrillation, disease process. Management and therapy including rate and rhythm control, anticoagulation benefits and potential risks were discussed extensively with the patient. Patient had multiple questions which were answered to patient's satisfaction. 2. History of pulmonary embolism: Patient is taking anticoagulation and this will help him in this perspective. 3. Patient will continue current medications.  He has some feeling of weakness which I presume is from his recent admission for pneumonia.  I will do electrolytes checked today.  I will do a ZIO monitor for 3 days to check if his atrial fibrillation is well controlled.  He will be seen in follow-up appointment in a month or earlier if he has any concerns.   Medication Adjustments/Labs and Tests Ordered: Current medicines are reviewed at length with the patient today.  Concerns regarding medicines are outlined above.  No orders of the defined types were placed in this encounter.  No orders of the defined types were placed in this encounter.    No chief complaint on file.    History of Present Illness:    Edward Johnston is a 68 y.o. male.  Patient was in [facility] recently.  He had pneumonia according to the patient's wife.  He has atrial fibrillation.  He denies any problems at this time except generalized weakness.  No chest pain orthopnea or PND.  At the time of my evaluation, the  patient is alert awake oriented and in no distress.  Past Medical History:  Diagnosis Date  . Anxiety   . Complication of anesthesia   . Depression   . Dysrhythmia   . Hypertension    due to psych med  . Incontinence   . Persistent atrial fibrillation ([place])   . PONV (postoperative nausea and vomiting)   . Shortness of breath dyspnea     Past Surgical History:  Procedure Laterality Date  .  aneurysm clip    . BRAIN SURGERY  1989   aneurysm clipping- [facility]  . JOINT REPLACEMENT    . LYMPH NODE BIOPSY N/A 11/16/2015   Procedure: Biopsy of node 7,4L and 10L;  Surgeon: Grace Isaac, MD;  Location: [facility];  Service: Thoracic;  Laterality: N/A;  . PROSTATECTOMY    . REPLACEMENT TOTAL KNEE    . VIDEO BRONCHOSCOPY WITH ENDOBRONCHIAL ULTRASOUND N/A 11/16/2015   Procedure: VIDEO BRONCHOSCOPY WITH ENDOBRONCHIAL ULTRASOUND;  Surgeon: Grace Isaac, MD;  Location: [facility];  Service: Thoracic;  Laterality: N/A;    Current Medications: Current Meds  Medication Sig  . apixaban (ELIQUIS) 5 MG TABS tablet Take 5 mg by mouth 2 (two) times daily.  . ARIPiprazole (ABILIFY) 2 MG tablet Take 1 mg by mouth at bedtime.   Marland Kitchen aspirin EC 81 MG tablet Take 81 mg by mouth daily.  Marland Kitchen buPROPion (WELLBUTRIN XL) 300 MG 24 hr tablet Take 300 mg by mouth daily.   . clorazepate (  TRANXENE) 7.5 MG tablet Take 3.75 mg by mouth 2 (two) times daily as needed for anxiety.   . clotrimazole-betamethasone (LOTRISONE) cream Apply 1 application topically as needed.  . latanoprost (XALATAN) 0.005 % ophthalmic solution Place 1 drop into the left eye at bedtime.  . metoprolol succinate (TOPROL-XL) 100 MG 24 hr tablet TAKE 1 TABLET (100 MG TOTAL) BY MOUTH DAILY. TAKE WITH OR IMMEDIATELY FOLLOWING A MEAL.  Marland Kitchen omeprazole (PRILOSEC) 20 MG capsule Take 20 mg by mouth daily.  Marland Kitchen terbinafine (LAMISIL) 250 MG tablet Take 250 mg by mouth daily.  . Vilazodone HCl (VIIBRYD) 20 MG TABS Take 20 mg by mouth daily.      Allergies:    Digoxin   Social History   Socioeconomic History  . Marital status: Married    Spouse name: Not on file  . Number of children: Not on file  . Years of education: Not on file  . Highest education level: Not on file  Occupational History  . Not on file  Social Needs  . Financial resource strain: Not on file  . Food insecurity    Worry: Not on file    Inability: Not on file  . Transportation needs    Medical: Not on file    Non-medical: Not on file  Tobacco Use  . Smoking status: Never Smoker  . Smokeless tobacco: Never Used  Substance and Sexual Activity  . Alcohol use: No  . Drug use: No  . Sexual activity: Not Currently  Lifestyle  . Physical activity    Days per week: Not on file    Minutes per session: Not on file  . Stress: Not on file  Relationships  . Social Herbalist on phone: Not on file    Gets together: Not on file    Attends religious service: Not on file    Active member of club or organization: Not on file    Attends meetings of clubs or organizations: Not on file    Relationship status: Not on file  Other Topics Concern  . Not on file  Social History Narrative  . Not on file     Family History: The patient's family history includes Depression in his sister.  ROS:   Please see the history of present illness.    All other systems reviewed and are negative.  EKGs/Labs/Other Studies Reviewed:    The following studies were reviewed today: EKG reveals atrial fibrillation with fairly controlled ventricular rate   Recent Labs: No results found for requested labs within last 8760 hours.  Recent Lipid Panel No results found for: CHOL, TRIG, HDL, CHOLHDL, VLDL, LDLCALC, LDLDIRECT  Physical Exam:    VS:  BP 138/74 (BP Location: Left Arm, Patient Position: Sitting)   Pulse 93   Temp 97.9 F (36.6 C) (Temporal)   Ht 6' (1.829 m)   Wt 274 lb (124.3 kg)   BMI 37.16 kg/m     Wt Readings from Last 3 Encounters:  11/13/18 274 lb (124.3  kg)  11/27/17 273 lb (123.8 kg)  10/31/17 279 lb (126.6 kg)     GEN: Patient is in no acute distress HEENT: Normal NECK: No JVD; No carotid bruits LYMPHATICS: No lymphadenopathy CARDIAC: Hear sounds regular, 2/6 systolic murmur at the apex. RESPIRATORY:  Clear to auscultation without rales, wheezing or rhonchi  ABDOMEN: Soft, non-tender, non-distended MUSCULOSKELETAL:  No edema; No deformity  SKIN: Warm and dry NEUROLOGIC:  Alert and oriented x 3 PSYCHIATRIC:  Normal affect   Signed, Jenean Lindau, MD  11/13/2018 2:23 PM    [place]

## 2018-11-13 NOTE — Patient Instructions (Signed)
Medication Instructions: Your physician recommends that you continue on your current medications as directed. Please refer to the Current Medication list given to you today.  If you need a refill on your cardiac medications before your next appointment, please call your pharmacy.   Lab work: BMET TSH  If you have labs (blood work) drawn today and your tests are completely normal, you will receive your results only by: Marland Kitchen MyChart Message (if you have MyChart) OR . A paper copy in the mail If you have any lab test that is abnormal or we need to change your treatment, we will call you to review the results.  Testing/Procedures: ZIO 3 day Monitor  Follow-Up: At [facility], you and your health needs are our priority.  As part of our continuing mission to provide you with exceptional heart care, we have created designated Provider Care Teams.  These Care Teams include your primary Cardiologist (physician) and Advanced Practice Providers (APPs -  Physician Assistants and Nurse Practitioners) who all work together to provide you with the care you need, when you need it. . You will need a follow up appointment in 1 months.  .  Your physician has recommended that you wear a holter monitor. Holter monitors are medical devices that record the heart's electrical activity. Doctors most often use these monitors to diagnose arrhythmias. Arrhythmias are problems with the speed or rhythm of the heartbeat. The monitor is a small, portable device. You can wear one while you do your normal daily activities. This is usually used to diagnose what is causing palpitations/syncope (passing out).

## 2018-11-13 NOTE — Addendum Note (Signed)
Addended by: Polly Cobia A on: 11/13/2018 03:10 PM   Modules accepted: Orders

## 2018-11-13 NOTE — Addendum Note (Signed)
Addended by: Polly Cobia A on: 11/13/2018 03:27 PM   Modules accepted: Orders

## 2018-11-14 ENCOUNTER — Telehealth: Payer: Self-pay | Admitting: Cardiology

## 2018-11-14 ENCOUNTER — Telehealth: Payer: Self-pay | Admitting: *Deleted

## 2018-11-14 ENCOUNTER — Other Ambulatory Visit (INDEPENDENT_AMBULATORY_CARE_PROVIDER_SITE_OTHER): Payer: PPO

## 2018-11-14 DIAGNOSIS — I4821 Permanent atrial fibrillation: Secondary | ICD-10-CM

## 2018-11-14 DIAGNOSIS — H401122 Primary open-angle glaucoma, left eye, moderate stage: Secondary | ICD-10-CM | POA: Diagnosis not present

## 2018-11-14 DIAGNOSIS — I4892 Unspecified atrial flutter: Secondary | ICD-10-CM

## 2018-11-14 DIAGNOSIS — H2513 Age-related nuclear cataract, bilateral: Secondary | ICD-10-CM | POA: Diagnosis not present

## 2018-11-14 DIAGNOSIS — I10 Essential (primary) hypertension: Secondary | ICD-10-CM

## 2018-11-14 LAB — TSH: TSH: 2.7 u[IU]/mL (ref 0.450–4.500)

## 2018-11-14 LAB — BASIC METABOLIC PANEL
BUN/Creatinine Ratio: 14 (ref 10–24)
BUN: 14 mg/dL (ref 8–27)
CO2: 24 mmol/L (ref 20–29)
Calcium: 9.9 mg/dL (ref 8.6–10.2)
Chloride: 102 mmol/L (ref 96–106)
Creatinine, Ser: 1 mg/dL (ref 0.76–1.27)
GFR calc Af Amer: 88 mL/min/{1.73_m2} (ref >59)
GFR calc non Af Amer: 76 mL/min/{1.73_m2} (ref >59)
Glucose: 141 mg/dL — ABNORMAL HIGH (ref 65–99)
Potassium: 5.3 mmol/L — ABNORMAL HIGH (ref 3.5–5.2)
Sodium: 140 mmol/L (ref 134–144)

## 2018-11-14 NOTE — Telephone Encounter (Signed)
Telephone call to patient. Left message to call back regarding labs results.

## 2018-11-14 NOTE — Telephone Encounter (Signed)
-----   Message from Jenean Lindau, MD sent at 11/14/2018 11:58 AM EDT ----- Potassium is elevated.  Please keep diet low in potassium and recheck in 1 week. Jenean Lindau, MD 11/14/2018 11:58 AM

## 2018-11-14 NOTE — Addendum Note (Signed)
Addended by: Beckey Rutter on: 11/14/2018 09:05 AM   Modules accepted: Orders

## 2018-11-15 NOTE — Telephone Encounter (Signed)
Wife called wanting to know more regarding labs. Informed that his potassium was 5.3. She verbalized understanding to recheck next week a BMP.

## 2018-11-15 NOTE — Telephone Encounter (Signed)
Telephone call to patient. Left message to keep diet low in potassium and recheck BMP in 1 week and to call with any questions.

## 2018-11-15 NOTE — Telephone Encounter (Signed)
Patient's wife called back and still has some questions. Please call [phone]

## 2018-11-18 ENCOUNTER — Telehealth: Payer: Self-pay | Admitting: *Deleted

## 2018-11-18 NOTE — Telephone Encounter (Signed)
-----   Message from Jenean Lindau, MD sent at 11/14/2018 11:58 AM EDT ----- Potassium is elevated.  Please keep diet low in potassium and recheck in 1 week. Jenean Lindau, MD 11/14/2018 11:58 AM

## 2018-11-18 NOTE — Telephone Encounter (Signed)
Spoke with wife per DPR. They received the message to come by for repeat bloodwork in the next day or two

## 2018-11-19 DIAGNOSIS — Z7982 Long term (current) use of aspirin: Secondary | ICD-10-CM | POA: Diagnosis not present

## 2018-11-19 DIAGNOSIS — Z7901 Long term (current) use of anticoagulants: Secondary | ICD-10-CM | POA: Diagnosis not present

## 2018-11-19 DIAGNOSIS — F329 Major depressive disorder, single episode, unspecified: Secondary | ICD-10-CM | POA: Diagnosis not present

## 2018-11-19 DIAGNOSIS — F419 Anxiety disorder, unspecified: Secondary | ICD-10-CM | POA: Diagnosis not present

## 2018-11-19 DIAGNOSIS — I10 Essential (primary) hypertension: Secondary | ICD-10-CM | POA: Diagnosis not present

## 2018-11-19 DIAGNOSIS — I4892 Unspecified atrial flutter: Secondary | ICD-10-CM | POA: Diagnosis not present

## 2018-11-19 DIAGNOSIS — I4821 Permanent atrial fibrillation: Secondary | ICD-10-CM | POA: Diagnosis not present

## 2018-11-19 DIAGNOSIS — Z86711 Personal history of pulmonary embolism: Secondary | ICD-10-CM | POA: Diagnosis not present

## 2018-11-19 DIAGNOSIS — M1991 Primary osteoarthritis, unspecified site: Secondary | ICD-10-CM | POA: Diagnosis not present

## 2018-11-19 DIAGNOSIS — J9601 Acute respiratory failure with hypoxia: Secondary | ICD-10-CM | POA: Diagnosis not present

## 2018-11-19 DIAGNOSIS — J189 Pneumonia, unspecified organism: Secondary | ICD-10-CM | POA: Diagnosis not present

## 2018-11-19 DIAGNOSIS — I482 Chronic atrial fibrillation, unspecified: Secondary | ICD-10-CM | POA: Diagnosis not present

## 2018-11-20 LAB — BASIC METABOLIC PANEL
BUN/Creatinine Ratio: 16 (ref 10–24)
BUN: 17 mg/dL (ref 8–27)
CO2: 19 mmol/L — ABNORMAL LOW (ref 20–29)
Calcium: 9.7 mg/dL (ref 8.6–10.2)
Chloride: 103 mmol/L (ref 96–106)
Creatinine, Ser: 1.06 mg/dL (ref 0.76–1.27)
GFR calc Af Amer: 82 mL/min/{1.73_m2} (ref >59)
GFR calc non Af Amer: 71 mL/min/{1.73_m2} (ref >59)
Glucose: 148 mg/dL — ABNORMAL HIGH (ref 65–99)
Potassium: 4.5 mmol/L (ref 3.5–5.2)
Sodium: 139 mmol/L (ref 134–144)

## 2018-11-25 ENCOUNTER — Other Ambulatory Visit: Payer: Self-pay | Admitting: Cardiology

## 2018-11-25 DIAGNOSIS — I4892 Unspecified atrial flutter: Secondary | ICD-10-CM

## 2018-11-25 DIAGNOSIS — I482 Chronic atrial fibrillation, unspecified: Secondary | ICD-10-CM | POA: Diagnosis not present

## 2018-12-02 DIAGNOSIS — D519 Vitamin B12 deficiency anemia, unspecified: Secondary | ICD-10-CM | POA: Diagnosis not present

## 2018-12-04 ENCOUNTER — Telehealth: Payer: Self-pay

## 2018-12-04 DIAGNOSIS — M7711 Lateral epicondylitis, right elbow: Secondary | ICD-10-CM | POA: Diagnosis not present

## 2018-12-04 NOTE — Telephone Encounter (Signed)
-----   Message from Jenean Lindau, MD sent at 11/30/2018  2:06 PM EDT ----- The results of the study is unremarkable. Please inform patient. I will discuss in detail at next appointment. Cc  primary care/referring physician Jenean Lindau, MD 11/30/2018 2:06 PM

## 2018-12-04 NOTE — Telephone Encounter (Signed)
Information relayed to [facility], copy sent to Dr. Lin Landsman per Dr. Docia Furl request.

## 2018-12-27 DIAGNOSIS — G4733 Obstructive sleep apnea (adult) (pediatric): Secondary | ICD-10-CM | POA: Diagnosis not present

## 2019-01-06 ENCOUNTER — Encounter: Payer: Self-pay | Admitting: Cardiology

## 2019-01-06 ENCOUNTER — Other Ambulatory Visit: Payer: Self-pay

## 2019-01-06 ENCOUNTER — Ambulatory Visit (INDEPENDENT_AMBULATORY_CARE_PROVIDER_SITE_OTHER): Payer: PPO | Admitting: Cardiology

## 2019-01-06 VITALS — BP 98/70 | HR 89 | Ht 72.0 in | Wt 270.6 lb

## 2019-01-06 DIAGNOSIS — D519 Vitamin B12 deficiency anemia, unspecified: Secondary | ICD-10-CM | POA: Diagnosis not present

## 2019-01-06 DIAGNOSIS — Z86711 Personal history of pulmonary embolism: Secondary | ICD-10-CM

## 2019-01-06 DIAGNOSIS — Z8679 Personal history of other diseases of the circulatory system: Secondary | ICD-10-CM

## 2019-01-06 DIAGNOSIS — I4821 Permanent atrial fibrillation: Secondary | ICD-10-CM

## 2019-01-06 DIAGNOSIS — R0600 Dyspnea, unspecified: Secondary | ICD-10-CM | POA: Diagnosis not present

## 2019-01-06 DIAGNOSIS — Z7901 Long term (current) use of anticoagulants: Secondary | ICD-10-CM

## 2019-01-06 DIAGNOSIS — G4730 Sleep apnea, unspecified: Secondary | ICD-10-CM | POA: Diagnosis not present

## 2019-01-06 DIAGNOSIS — I2602 Saddle embolus of pulmonary artery with acute cor pulmonale: Secondary | ICD-10-CM | POA: Diagnosis not present

## 2019-01-06 DIAGNOSIS — Z9889 Other specified postprocedural states: Secondary | ICD-10-CM

## 2019-01-06 DIAGNOSIS — Z23 Encounter for immunization: Secondary | ICD-10-CM | POA: Diagnosis not present

## 2019-01-06 NOTE — Patient Instructions (Addendum)
Medication Instructions:  Your physician recommends that you continue on your current medications as directed. Please refer to the Current Medication list given to you today.  If you need a refill on your cardiac medications before your next appointment, please call your pharmacy.   Lab work: Your physician recommends that you have a BMP drawn today.  If you have labs (blood work) drawn today and your tests are completely normal, you will receive your results only by: Marland Kitchen MyChart Message (if you have MyChart) OR . A paper copy in the mail If you have any lab test that is abnormal or we need to change your treatment, we will call you to review the results.  Testing/Procedures: Your physician has requested that you have an echocardiogram. Echocardiography is a painless test that uses sound waves to create images of your heart. It provides your doctor with information about the size and shape of your heart and how well your heart's chambers and valves are working. This procedure takes approximately one hour. There are no restrictions for this procedure.  Your physician has requested that you have a lexiscan myoview. For further information please visit [email or web address]. Please follow instruction sheet, as given.    Follow-Up: At [facility], you and your health needs are our priority.  As part of our continuing mission to provide you with exceptional heart care, we have created designated Provider Care Teams.  These Care Teams include your primary Cardiologist (physician) and Advanced Practice Providers (APPs -  Physician Assistants and Nurse Practitioners) who all work together to provide you with the care you need, when you need it. You will need a follow up appointment in 2 months.   Any Other Special Instructions Will Be Listed Below  Regadenoson injection What is this medicine? REGADENOSON is used to test the heart for coronary artery disease. It is used in patients who can not  exercise for their stress test. This medicine may be used for other purposes; ask your health care provider or pharmacist if you have questions. COMMON BRAND NAME(S): Lexiscan What should I tell my health care provider before I take this medicine? They need to know if you have any of these conditions:  heart problems  lung or breathing disease, like asthma or COPD  an unusual or allergic reaction to regadenoson, other medicines, foods, dyes, or preservatives  pregnant or trying to get pregnant  breast-feeding How should I use this medicine? This medicine is for injection into a vein. It is given by a health care professional in a hospital or clinic setting. Talk to your pediatrician regarding the use of this medicine in children. Special care may be needed. Overdosage: If you think you have taken too much of this medicine contact a poison control center or emergency room at once. NOTE: This medicine is only for you. Do not share this medicine with others. What if I miss a dose? This does not apply. What may interact with this medicine?  caffeine  dipyridamole  guarana  theophylline This list may not describe all possible interactions. Give your health care provider a list of all the medicines, herbs, non-prescription drugs, or dietary supplements you use. Also tell them if you smoke, drink alcohol, or use illegal drugs. Some items may interact with your medicine. What should I watch for while using this medicine? Your condition will be monitored carefully while you are receiving this medicine. Do not take medicines, foods, or drinks with caffeine (like coffee, tea, or colas) for at  least 12 hours before your test. If you do not know if something contains caffeine, ask your health care professional. What side effects may I notice from receiving this medicine? Side effects that you should report to your doctor or health care professional as soon as possible:  allergic reactions  like skin rash, itching or hives, swelling of the face, lips, or tongue  breathing problems  chest pain, tightness or palpitations  severe headache Side effects that usually do not require medical attention (report to your doctor or health care professional if they continue or are bothersome):  flushing  headache  irritation or pain at site where injected  nausea, vomiting This list may not describe all possible side effects. Call your doctor for medical advice about side effects. You may report side effects to FDA at 1-800-FDA-1088. Where should I keep my medicine? This drug is given in a hospital or clinic and will not be stored at home. NOTE: This sheet is a summary. It may not cover all possible information. If you have questions about this medicine, talk to your doctor, pharmacist, or health care provider.  2020 Elsevier/Gold Standard (2007-11-18 15:08:13)  Cardiac Nuclear Scan A cardiac nuclear scan is a test that is done to check the flow of blood to your heart. It is done when you are resting and when you are exercising. The test looks for problems such as:  Not enough blood reaching a portion of the heart.  The heart muscle not working as it should. You may need this test if:  You have heart disease.  You have had lab results that are not normal.  You have had heart surgery or a balloon procedure to open up blocked arteries (angioplasty).  You have chest pain.  You have shortness of breath. In this test, a special dye (tracer) is put into your bloodstream. The tracer will travel to your heart. A camera will then take pictures of your heart to see how the tracer moves through your heart. This test is usually done at a hospital and takes 2-4 hours. Tell a doctor about:  Any allergies you have.  All medicines you are taking, including vitamins, herbs, eye drops, creams, and over-the-counter medicines.  Any problems you or family members have had with anesthetic  medicines.  Any blood disorders you have.  Any surgeries you have had.  Any medical conditions you have.  Whether you are pregnant or may be pregnant. What are the risks? Generally, this is a safe test. However, problems may occur, such as:  Serious chest pain and heart attack. This is only a risk if the stress portion of the test is done.  Rapid heartbeat.  A feeling of warmth in your chest. This feeling usually does not last long.  Allergic reaction to the tracer. What happens before the test?  Ask your doctor about changing or stopping your normal medicines. This is important.  Follow instructions from your doctor about what you cannot eat or drink.  Remove your jewelry on the day of the test. What happens during the test?  An IV tube will be inserted into one of your veins.  Your doctor will give you a small amount of tracer through the IV tube.  You will wait for 20-40 minutes while the tracer moves through your bloodstream.  Your heart will be monitored with an electrocardiogram (ECG).  You will lie down on an exam table.  Pictures of your heart will be taken for about 15-20 minutes.  You may also have a stress test. For this test, one of these things may be done: ? You will be asked to exercise on a treadmill or a stationary bike. ? You will be given medicines that will make your heart work harder. This is done if you are unable to exercise.  When blood flow to your heart has peaked, a tracer will again be given through the IV tube.  After 20-40 minutes, you will get back on the exam table. More pictures will be taken of your heart.  Depending on the tracer that is used, more pictures may need to be taken 3-4 hours later.  Your IV tube will be removed when the test is over. The test may vary among doctors and hospitals. What happens after the test?  Ask your doctor: ? Whether you can return to your normal schedule, including diet, activities, and  medicines. ? Whether you should drink more fluids. This will help to remove the tracer from your body. Drink enough fluid to keep your pee (urine) pale yellow.  Ask your doctor, or the department that is doing the test: ? When will my results be ready? ? How will I get my results? Summary  A cardiac nuclear scan is a test that is done to check the flow of blood to your heart.  Tell your doctor whether you are pregnant or may be pregnant.  Before the test, ask your doctor about changing or stopping your normal medicines. This is important.  Ask your doctor whether you can return to your normal activities. You may be asked to drink more fluids. This information is not intended to replace advice given to you by your health care provider. Make sure you discuss any questions you have with your health care provider. Document Released: 09/03/2017 Document Revised: 07/10/2018 Document Reviewed: 09/03/2017 Elsevier Patient Education  [place].  Echocardiogram An echocardiogram is a procedure that uses painless sound waves (ultrasound) to produce an image of the heart. Images from an echocardiogram can provide important information about:  Signs of coronary artery disease (CAD).  Aneurysm detection. An aneurysm is a weak or damaged part of an artery wall that bulges out from the normal force of blood pumping through the body.  Heart size and shape. Changes in the size or shape of the heart can be associated with certain conditions, including heart failure, aneurysm, and CAD.  Heart muscle function.  Heart valve function.  Signs of a past heart attack.  Fluid buildup around the heart.  Thickening of the heart muscle.  A tumor or infectious growth around the heart valves. Tell a health care provider about:  Any allergies you have.  All medicines you are taking, including vitamins, herbs, eye drops, creams, and over-the-counter medicines.  Any blood disorders you have.   Any surgeries you have had.  Any medical conditions you have.  Whether you are pregnant or may be pregnant. What are the risks? Generally, this is a safe procedure. However, problems may occur, including:  Allergic reaction to dye (contrast) that may be used during the procedure. What happens before the procedure? No specific preparation is needed. You may eat and drink normally. What happens during the procedure?   An IV tube may be inserted into one of your veins.  You may receive contrast through this tube. A contrast is an injection that improves the quality of the pictures from your heart.  A gel will be applied to your chest.  A wand-like  tool (transducer) will be moved over your chest. The gel will help to transmit the sound waves from the transducer.  The sound waves will harmlessly bounce off of your heart to allow the heart images to be captured in real-time motion. The images will be recorded on a computer. The procedure may vary among health care providers and hospitals. What happens after the procedure?  You may return to your normal, everyday life, including diet, activities, and medicines, unless your health care provider tells you not to do that. Summary  An echocardiogram is a procedure that uses painless sound waves (ultrasound) to produce an image of the heart.  Images from an echocardiogram can provide important information about the size and shape of your heart, heart muscle function, heart valve function, and fluid buildup around your heart.  You do not need to do anything to prepare before this procedure. You may eat and drink normally.  After the echocardiogram is completed, you may return to your normal, everyday life, unless your health care provider tells you not to do that. This information is not intended to replace advice given to you by your health care provider. Make sure you discuss any questions you have with your health care provider. Document  Released: 03/17/2000 Document Revised: 07/11/2018 Document Reviewed: 04/22/2016 Elsevier Patient Education  2020 [employer].

## 2019-01-06 NOTE — Progress Notes (Addendum)
Cardiology Office Note:    Date:  01/06/2019   ID:  Edward Johnston, DOB [birth year], MRN [id number]  PCP:  Angelina Sheriff, MD  Cardiologist:  Jenean Lindau, MD   Referring MD: Angelina Sheriff, MD    ASSESSMENT:    1. Permanent atrial fibrillation ([place])   2. Acute saddle pulmonary embolism with acute cor pulmonale (HCC)   3. Sleep apnea, unspecified type   4. Current use of long term anticoagulation   5. History of cerebral aneurysm repair   6. History of pulmonary embolism    PLAN:    In order of problems listed above:  1. I discussed my findings with the patient at extensive length.  Atrial fibrillation is stable.  I increased his medications which is beta-blocker and calcium channel blocker because of elevated heart rates.I discussed with the patient atrial fibrillation, disease process. Management and therapy including rate and rhythm control, anticoagulation benefits and potential risks were discussed extensively with the patient. Patient had multiple questions which were answered to patient's satisfaction.  I think because of his medications he is developing some degree of postural hypotension.  Fluid intake was reemphasized and salt intake also.  We will have a Chem-7 today as he has had issues with his potassium in the past. 2. I have asked him to keep a track of his pulse and blood pressure and get it to me in a week and I will appropriately titrate medications as necessary 3. History of pulmonary embolism: On anticoagulation 4. History of mass in the lung followed by cardiac surgery 5. In view of his shortness of breath on exertion we will have an echocardiogram.  This will help me assess left ventricular systolic function and left atrial size as pertaining to atrial fibrillation.  Patient will undergo Lexiscan sestamibi to assess the symptoms also. 6. Patient will be seen in follow-up appointment in 6 months or earlier if the patient has any concerns   Stress  test was abnormal and therefore we discussed CT coronary angiography and the patient is agreeable and we will schedule him for this. Signed Dr. Sunny Schlein Revankar 01/31/2019 at 12:04 PM    Medication Adjustments/Labs and Tests Ordered: Current medicines are reviewed at length with the patient today.  Concerns regarding medicines are outlined above.  No orders of the defined types were placed in this encounter.  No orders of the defined types were placed in this encounter.    No chief complaint on file.    History of Present Illness:    Edward Johnston is a 68 y.o. male.  Patient has history of permanent atrial fibrillation, saddle embolism.  He has atrial fibrillation and is on anticoagulation.  He denies any chest pain orthopnea or PND.  He just feels weak and a little lightheaded when he changes posture.  At the time of my evaluation, the patient is alert awake oriented and in no distress.  His wife accompanies him for this visit and she is very supportive.  Past Medical History:  Diagnosis Date  . Anxiety   . Complication of anesthesia   . Depression   . Dysrhythmia   . Hypertension    due to psych med  . Incontinence   . Persistent atrial fibrillation ([place])   . PONV (postoperative nausea and vomiting)   . Shortness of breath dyspnea     Past Surgical History:  Procedure Laterality Date  .  aneurysm clip    . [place]  aneurysm clipping- [facility]  . JOINT REPLACEMENT    . LYMPH NODE BIOPSY N/A 11/16/2015   Procedure: Biopsy of node 7,4L and 10L;  Surgeon: Grace Isaac, MD;  Location: [facility];  Service: Thoracic;  Laterality: N/A;  . PROSTATECTOMY    . REPLACEMENT TOTAL KNEE    . VIDEO BRONCHOSCOPY WITH ENDOBRONCHIAL ULTRASOUND N/A 11/16/2015   Procedure: VIDEO BRONCHOSCOPY WITH ENDOBRONCHIAL ULTRASOUND;  Surgeon: Grace Isaac, MD;  Location: [facility];  Service: Thoracic;  Laterality: N/A;    Current Medications: Current Meds  Medication Sig  . apixaban  (ELIQUIS) 5 MG TABS tablet Take 5 mg by mouth 2 (two) times daily.  . ARIPiprazole (ABILIFY) 2 MG tablet Take 1 mg by mouth at bedtime.   Marland Kitchen aspirin EC 81 MG tablet Take 81 mg by mouth daily.  Marland Kitchen buPROPion (WELLBUTRIN XL) 300 MG 24 hr tablet Take 300 mg by mouth daily.   . clorazepate (TRANXENE) 7.5 MG tablet Take 3.75 mg by mouth 3 (three) times daily.   . clotrimazole-betamethasone (LOTRISONE) cream Apply 1 application topically as needed.  . diltiazem (TIAZAC) 300 MG 24 hr capsule Take 1 capsule (300 mg total) by mouth daily.  Marland Kitchen latanoprost (XALATAN) 0.005 % ophthalmic solution Place 1 drop into the left eye at bedtime.  . metoprolol succinate (TOPROL-XL) 100 MG 24 hr tablet TAKE 1 TABLET (100 MG TOTAL) BY MOUTH DAILY. TAKE WITH OR IMMEDIATELY FOLLOWING A MEAL.  Marland Kitchen omeprazole (PRILOSEC) 20 MG capsule Take 20 mg by mouth daily.  . Vilazodone HCl (VIIBRYD) 20 MG TABS Take 20 mg by mouth daily.      Allergies:   Digoxin   Social History   Socioeconomic History  . Marital status: Married    Spouse name: Not on file  . Number of children: Not on file  . Years of education: Not on file  . Highest education level: Not on file  Occupational History  . Not on file  Social Needs  . Financial resource strain: Not on file  . Food insecurity    Worry: Not on file    Inability: Not on file  . Transportation needs    Medical: Not on file    Non-medical: Not on file  Tobacco Use  . Smoking status: Never Smoker  . Smokeless tobacco: Never Used  Substance and Sexual Activity  . Alcohol use: No  . Drug use: No  . Sexual activity: Not Currently  Lifestyle  . Physical activity    Days per week: Not on file    Minutes per session: Not on file  . Stress: Not on file  Relationships  . Social Herbalist on phone: Not on file    Gets together: Not on file    Attends religious service: Not on file    Active member of club or organization: Not on file    Attends meetings of clubs or  organizations: Not on file    Relationship status: Not on file  Other Topics Concern  . Not on file  Social History Narrative  . Not on file     Family History: The patient's family history includes Breast cancer in his mother; Depression in his sister; Diabetes in his sister; Heart attack in his father; Heart disease in his sister.  ROS:   Please see the history of present illness.    All other systems reviewed and are negative.  EKGs/Labs/Other Studies Reviewed:    The following studies were reviewed  today: EVENT MONITOR REPORT:   Patient was monitored from 11/13/2018 to 11/17/2018. Indication:                    Atrial fibrillation Ordering physician:  Jenean Lindau, MD  Referring physician:        Jenean Lindau, MD    Baseline rhythm: Atrial fibrillation  Minimum heart rate: 44 BPM.  Average heart rate: 85 BPM.  Maximal heart rate 150 BPM.  Atrial arrhythmia: Patient remained in atrial fibrillation throughout the study  Ventricular arrhythmia: Rare PVCs  Conduction abnormality: None significant  Symptoms: None significant   Conclusion:  Atrial fibrillation with mildly elevated ventricular rates.   Recent Labs: 11/13/2018: TSH 2.700 11/19/2018: BUN 17; Creatinine, Ser 1.06; Potassium 4.5; Sodium 139  Recent Lipid Panel No results found for: CHOL, TRIG, HDL, CHOLHDL, VLDL, LDLCALC, LDLDIRECT  Physical Exam:    VS:  BP 98/70 (BP Location: Right Arm, Patient Position: Sitting, Cuff Size: Normal)   Pulse 89   Ht 6' (1.829 m)   Wt 270 lb 9.6 oz (122.7 kg)   SpO2 95%   BMI 36.70 kg/m     Wt Readings from Last 3 Encounters:  01/06/19 270 lb 9.6 oz (122.7 kg)  11/13/18 274 lb (124.3 kg)  11/27/17 273 lb (123.8 kg)     GEN: Patient is in no acute distress HEENT: Normal NECK: No JVD; No carotid bruits LYMPHATICS: No lymphadenopathy CARDIAC: Hear sounds regular, 2/6 systolic murmur at the apex. RESPIRATORY:  Clear to auscultation without  rales, wheezing or rhonchi  ABDOMEN: Soft, non-tender, non-distended MUSCULOSKELETAL:  No edema; No deformity  SKIN: Warm and dry NEUROLOGIC:  Alert and oriented x 3 PSYCHIATRIC:  Normal affect   Signed, Jenean Lindau, MD  01/06/2019 3:40 PM    [facility]

## 2019-01-07 ENCOUNTER — Telehealth: Payer: Self-pay

## 2019-01-07 DIAGNOSIS — G4733 Obstructive sleep apnea (adult) (pediatric): Secondary | ICD-10-CM | POA: Diagnosis not present

## 2019-01-07 DIAGNOSIS — F330 Major depressive disorder, recurrent, mild: Secondary | ICD-10-CM | POA: Diagnosis not present

## 2019-01-07 DIAGNOSIS — F411 Generalized anxiety disorder: Secondary | ICD-10-CM | POA: Diagnosis not present

## 2019-01-07 LAB — BASIC METABOLIC PANEL
BUN/Creatinine Ratio: 11 (ref 10–24)
BUN: 11 mg/dL (ref 8–27)
CO2: 23 mmol/L (ref 20–29)
Calcium: 9.9 mg/dL (ref 8.6–10.2)
Chloride: 105 mmol/L (ref 96–106)
Creatinine, Ser: 1.01 mg/dL (ref 0.76–1.27)
GFR calc Af Amer: 87 mL/min/{1.73_m2} (ref >59)
GFR calc non Af Amer: 75 mL/min/{1.73_m2} (ref >59)
Glucose: 167 mg/dL — ABNORMAL HIGH (ref 65–99)
Potassium: 4.5 mmol/L (ref 3.5–5.2)
Sodium: 142 mmol/L (ref 134–144)

## 2019-01-07 NOTE — Telephone Encounter (Signed)
-----   Message from Jenean Lindau, MD sent at 01/07/2019  8:10 AM EDT ----- The results of the study is unremarkable. Please inform patient. I will discuss in detail at next appointment. Cc  primary care/referring physician Jenean Lindau, MD 01/07/2019 8:10 AM

## 2019-01-07 NOTE — Telephone Encounter (Signed)
Left message for patient to call back for lab results, copy sent Dr. Lin Landsman.

## 2019-01-09 NOTE — Telephone Encounter (Signed)
Left 2nd message for patient to call back for results.

## 2019-01-13 ENCOUNTER — Telehealth: Payer: Self-pay | Admitting: Cardiology

## 2019-01-13 ENCOUNTER — Telehealth: Payer: Self-pay

## 2019-01-13 NOTE — Telephone Encounter (Signed)
Wants lab results

## 2019-01-13 NOTE — Telephone Encounter (Signed)
Results relayed, no further questions. 

## 2019-01-13 NOTE — Telephone Encounter (Signed)
-----   Message from Jenean Lindau, MD sent at 01/07/2019  8:10 AM EDT ----- The results of the study is unremarkable. Please inform patient. I will discuss in detail at next appointment. Cc  primary care/referring physician Jenean Lindau, MD 01/07/2019 8:10 AM

## 2019-01-14 DIAGNOSIS — R0982 Postnasal drip: Secondary | ICD-10-CM | POA: Diagnosis not present

## 2019-01-14 DIAGNOSIS — R0600 Dyspnea, unspecified: Secondary | ICD-10-CM | POA: Diagnosis not present

## 2019-01-14 DIAGNOSIS — G4733 Obstructive sleep apnea (adult) (pediatric): Secondary | ICD-10-CM | POA: Diagnosis not present

## 2019-01-14 DIAGNOSIS — R918 Other nonspecific abnormal finding of lung field: Secondary | ICD-10-CM | POA: Diagnosis not present

## 2019-01-21 DIAGNOSIS — C61 Malignant neoplasm of prostate: Secondary | ICD-10-CM | POA: Diagnosis not present

## 2019-01-21 DIAGNOSIS — Z7901 Long term (current) use of anticoagulants: Secondary | ICD-10-CM | POA: Diagnosis not present

## 2019-01-21 DIAGNOSIS — N529 Male erectile dysfunction, unspecified: Secondary | ICD-10-CM | POA: Diagnosis not present

## 2019-01-21 DIAGNOSIS — Z8546 Personal history of malignant neoplasm of prostate: Secondary | ICD-10-CM | POA: Diagnosis not present

## 2019-01-22 ENCOUNTER — Telehealth (HOSPITAL_COMMUNITY): Payer: Self-pay | Admitting: *Deleted

## 2019-01-22 NOTE — Telephone Encounter (Signed)
Left message on voicemail per DPR in reference to upcoming appointment scheduled on 01/28/19 with detailed instructions given per Myocardial Perfusion Study Information Sheet for the test. LM to arrive 15 minutes early, and that it is imperative to arrive on time for appointment to keep from having the test rescheduled. If you need to cancel or reschedule your appointment, please call the office within 24 hours of your appointment. Failure to do so may result in a cancellation of your appointment, and a $50 no show fee. Phone number given for call back for any questions. Kirstie Peri

## 2019-01-25 ENCOUNTER — Telehealth: Payer: Self-pay | Admitting: Cardiology

## 2019-01-28 ENCOUNTER — Other Ambulatory Visit: Payer: Self-pay

## 2019-01-28 ENCOUNTER — Ambulatory Visit (INDEPENDENT_AMBULATORY_CARE_PROVIDER_SITE_OTHER): Payer: PPO

## 2019-01-28 DIAGNOSIS — R0600 Dyspnea, unspecified: Secondary | ICD-10-CM

## 2019-01-28 DIAGNOSIS — I4821 Permanent atrial fibrillation: Secondary | ICD-10-CM

## 2019-01-28 LAB — MYOCARDIAL PERFUSION IMAGING
Peak HR: 110 {beats}/min
Rest HR: 108 {beats}/min
SDS: 7
SRS: 2
SSS: 9
TID: 1

## 2019-01-28 MED ORDER — REGADENOSON 0.4 MG/5ML IV SOLN
0.4000 mg | Freq: Once | INTRAVENOUS | Status: AC
  Administered 2019-01-28: 0.4 mg via INTRAVENOUS

## 2019-01-28 MED ORDER — TECHNETIUM TC 99M TETROFOSMIN IV KIT
11.0000 | PACK | Freq: Once | INTRAVENOUS | Status: AC | PRN
  Administered 2019-01-28: 11 via INTRAVENOUS

## 2019-01-28 MED ORDER — TECHNETIUM TC 99M TETROFOSMIN IV KIT
31.1000 | PACK | Freq: Once | INTRAVENOUS | Status: AC | PRN
  Administered 2019-01-28: 31.1 via INTRAVENOUS

## 2019-01-29 ENCOUNTER — Telehealth: Payer: Self-pay

## 2019-01-29 DIAGNOSIS — R9431 Abnormal electrocardiogram [ECG] [EKG]: Secondary | ICD-10-CM

## 2019-01-29 NOTE — Telephone Encounter (Signed)
-----   Message from Jenean Lindau, MD sent at 01/29/2019  8:51 AM EDT ----- Please make sure his renal function is fine and set him up for CT coronary angiography to further evaluate these findings. Jenean Lindau, MD 01/29/2019 8:50 AM

## 2019-01-29 NOTE — Telephone Encounter (Signed)
Left message for patient to call back for results.  

## 2019-01-29 NOTE — Telephone Encounter (Signed)
Results relayed, patient is ok with having CT FFR performed. Orders entered, current labs available.

## 2019-01-29 NOTE — Addendum Note (Signed)
Addended by: Beckey Rutter on: 01/29/2019 04:33 PM   Modules accepted: Orders

## 2019-01-30 NOTE — Telephone Encounter (Signed)
°*  STAT* If patient is at the pharmacy, call can be transferred to refill team.   1. Which medications need to be refilled? (please list name of each medication and dose if known) Diltiazem  2. Which pharmacy/location (including street and city if local pharmacy) is medication to be sent to?[facility] on [address]   3. Do they need a 30 day or 90 day supply? [place]

## 2019-02-02 ENCOUNTER — Other Ambulatory Visit: Payer: Self-pay | Admitting: Cardiology

## 2019-02-03 DIAGNOSIS — D519 Vitamin B12 deficiency anemia, unspecified: Secondary | ICD-10-CM | POA: Diagnosis not present

## 2019-02-04 ENCOUNTER — Other Ambulatory Visit: Payer: Self-pay | Admitting: *Deleted

## 2019-02-04 DIAGNOSIS — G4733 Obstructive sleep apnea (adult) (pediatric): Secondary | ICD-10-CM | POA: Diagnosis not present

## 2019-02-04 MED ORDER — DILTIAZEM HCL ER BEADS 300 MG PO CP24: ORAL_CAPSULE | ORAL | 2 refills | Status: DC

## 2019-02-04 NOTE — Addendum Note (Signed)
Addended by: Beckey Rutter on: 02/04/2019 03:53 PM   Modules accepted: Orders

## 2019-02-10 ENCOUNTER — Other Ambulatory Visit: Payer: Self-pay

## 2019-02-10 ENCOUNTER — Ambulatory Visit (INDEPENDENT_AMBULATORY_CARE_PROVIDER_SITE_OTHER): Payer: PPO

## 2019-02-10 DIAGNOSIS — I4821 Permanent atrial fibrillation: Secondary | ICD-10-CM | POA: Diagnosis not present

## 2019-02-10 DIAGNOSIS — Z9889 Other specified postprocedural states: Secondary | ICD-10-CM

## 2019-02-10 DIAGNOSIS — Z86711 Personal history of pulmonary embolism: Secondary | ICD-10-CM | POA: Diagnosis not present

## 2019-02-10 DIAGNOSIS — Z8679 Personal history of other diseases of the circulatory system: Secondary | ICD-10-CM | POA: Diagnosis not present

## 2019-02-10 NOTE — Progress Notes (Signed)
Complete echocardiogram has been performed.  Jimmy Ailsa Mireles RDCS, RVT 

## 2019-02-12 ENCOUNTER — Telehealth: Payer: Self-pay

## 2019-02-12 NOTE — Telephone Encounter (Signed)
Results relayed.

## 2019-02-12 NOTE — Telephone Encounter (Signed)
Left message for patient to call back for results, copy sent to Dr. Lin Landsman.

## 2019-02-12 NOTE — Telephone Encounter (Signed)
-----   Message from Jenean Lindau, MD sent at 02/11/2019  4:56 PM EST ----- The results of the study is unremarkable.  Mildly depressed ejection fraction.  Will await results of CT angiography.  Please inform patient. I will discuss in detail at next appointment. Cc  primary care/referring physician Jenean Lindau, MD 02/11/2019 4:56 PM

## 2019-02-18 DIAGNOSIS — H401122 Primary open-angle glaucoma, left eye, moderate stage: Secondary | ICD-10-CM | POA: Diagnosis not present

## 2019-02-18 DIAGNOSIS — H2513 Age-related nuclear cataract, bilateral: Secondary | ICD-10-CM | POA: Diagnosis not present

## 2019-02-19 ENCOUNTER — Telehealth: Payer: Self-pay | Admitting: Cardiology

## 2019-02-19 NOTE — Telephone Encounter (Signed)
Pt. Wife calling back to check on the status of her husbands test. She said it has been 3 weeks and she has not heard anything back.

## 2019-02-20 NOTE — Telephone Encounter (Signed)
Spouse calling to advise that patient has still not heard back about scheduling CT FFR. RN sent message to S.Ferguson.

## 2019-02-27 IMAGING — CT CT CHEST W/O CM
3 of 4 series · 16 of 30 positions shown, 18 images · non-contrast
Comparison: Chest CT 06/15/2016.

CLINICAL DATA: 66-year-old male with history of pulmonary nodule.
Follow-up study. Additional history of low-grade follicular
lymphoma.

EXAM:
CT CHEST WITHOUT CONTRAST
TECHNIQUE: Multidetector CT imaging of the chest was performed following the
standard protocol without IV contrast.

[Series 3: chest w/o · axial · non-contrast · 0.74mm/px · z∈[-290,-47]mm · 7 of 131 slices shown, 9 images]
[im 17/131  mediastinal]
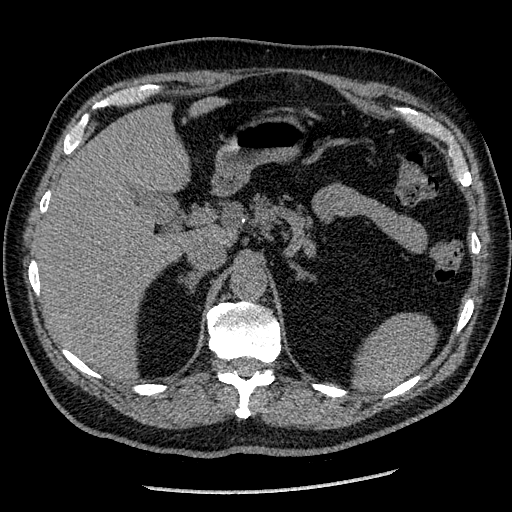
[im 17/131  lung]
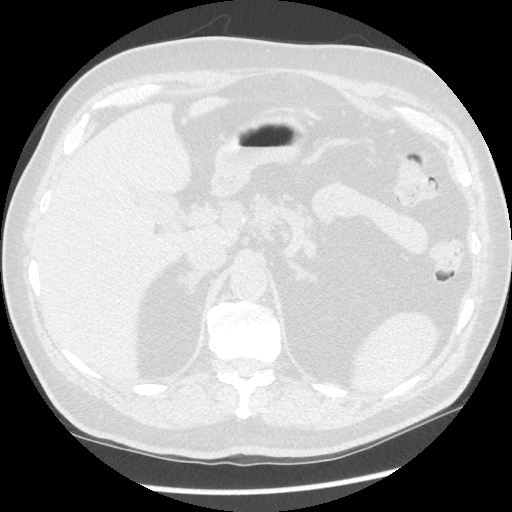
[im 33/131  lung]
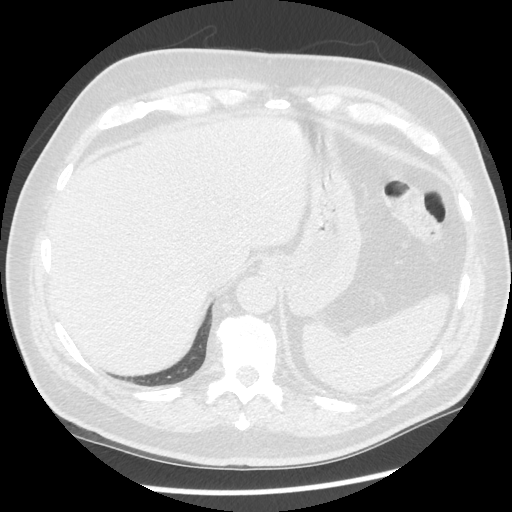
[im 49/131  lung]
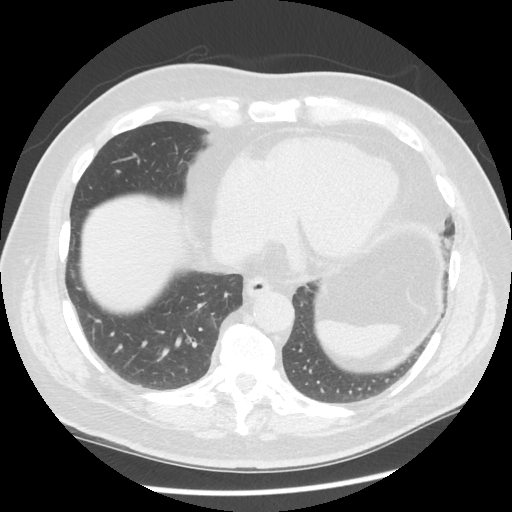
[im 66/131  lung]
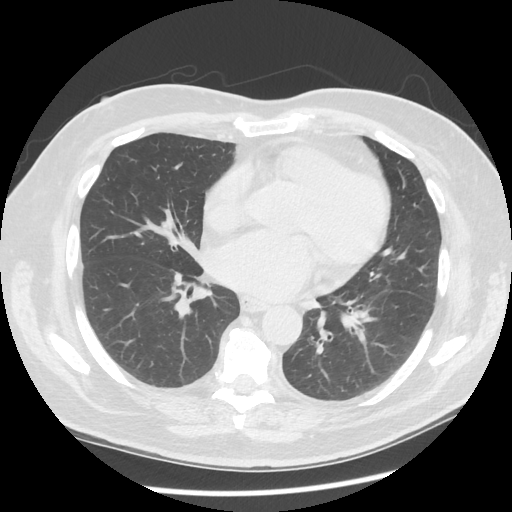
[im 82/131  mediastinal]
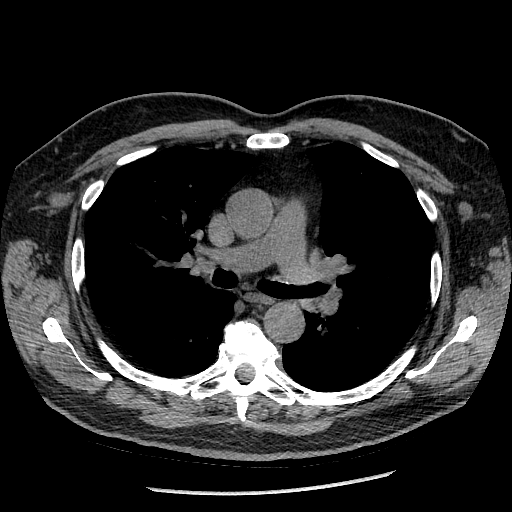
[im 82/131  lung]
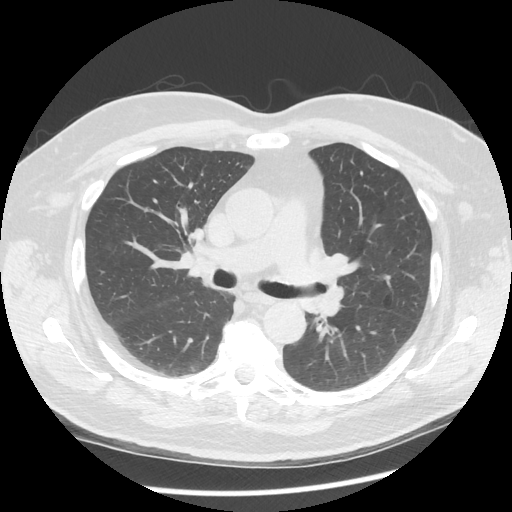
[im 98/131  lung]
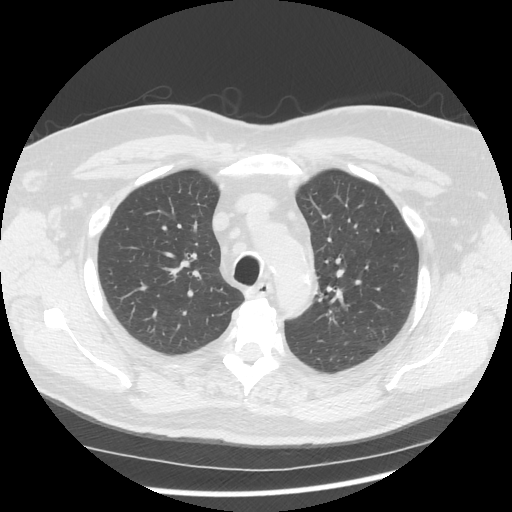
[im 114/131  lung]
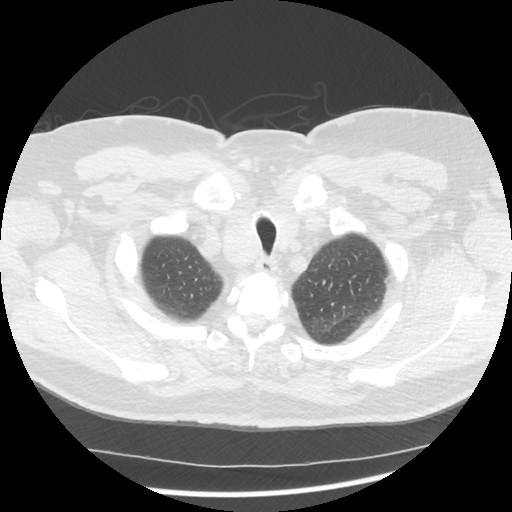

[Series 4: lung windows · axial · 0.74mm/px · z∈[-290,-47]mm · 7 of 131 slices shown]
[im 17/131  lung]
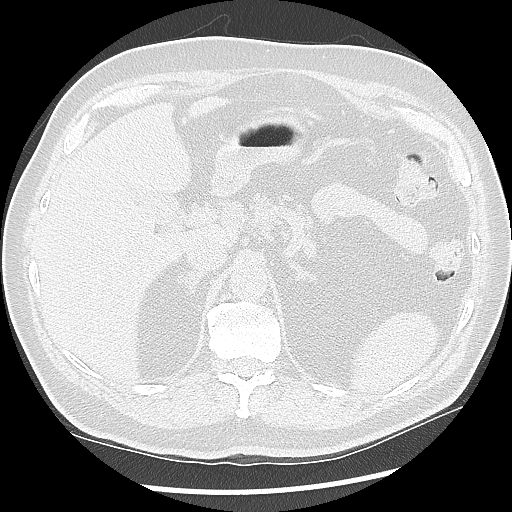
[im 33/131  lung]
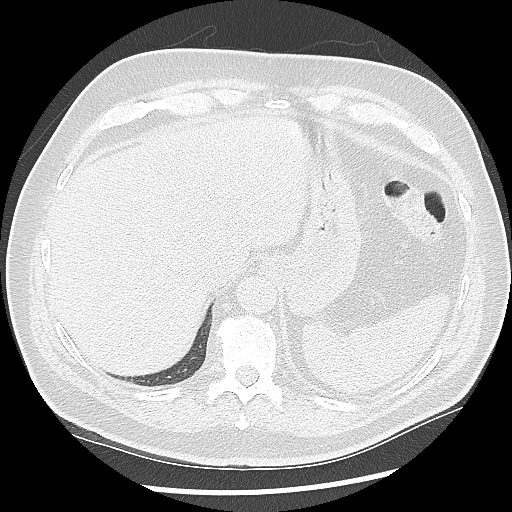
[im 49/131  lung]
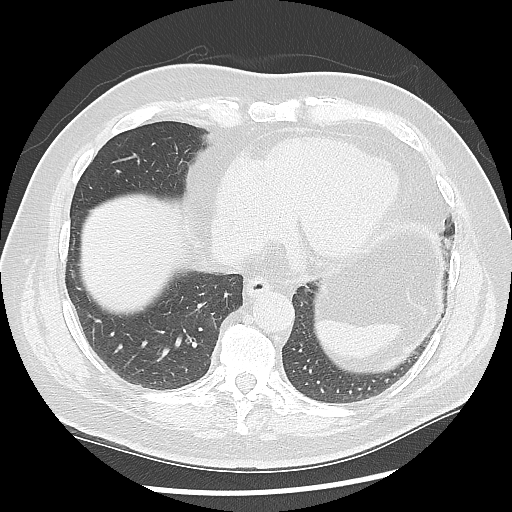
[im 66/131  lung]
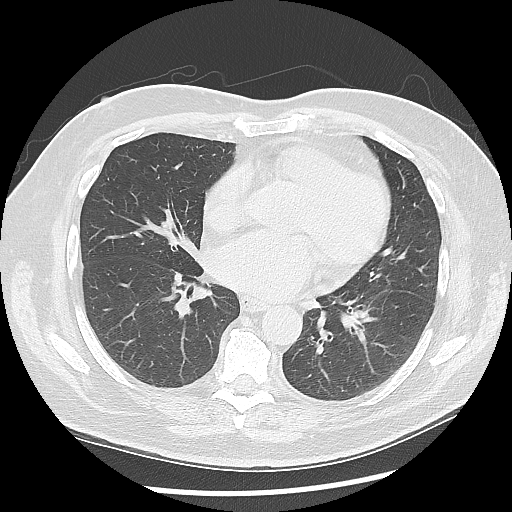
[im 82/131  lung]
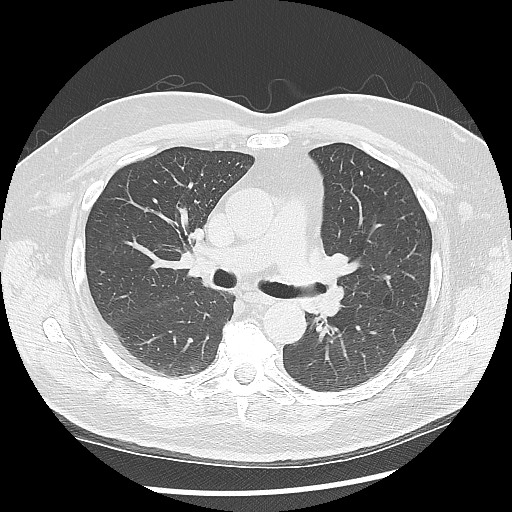
[im 98/131  lung]
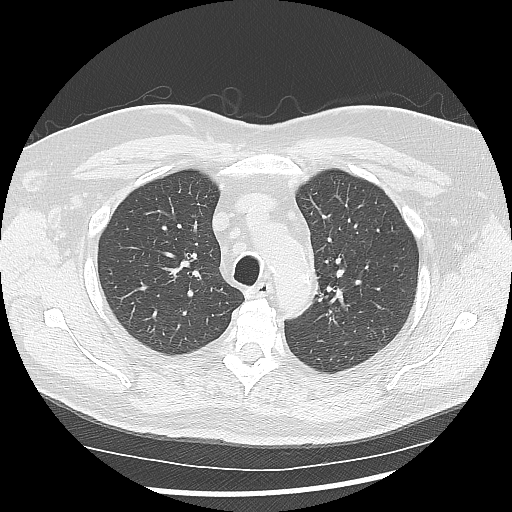
[im 114/131  lung]
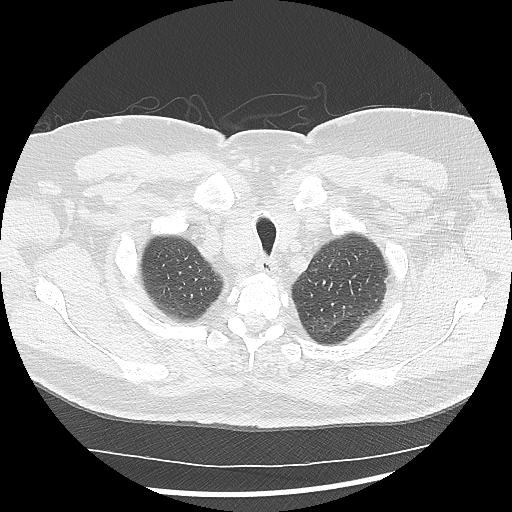

[Series 602: sagittal body · sagittal · 0.74mm/px · 2 of 153 slices shown]
[im 17/153  mediastinal]
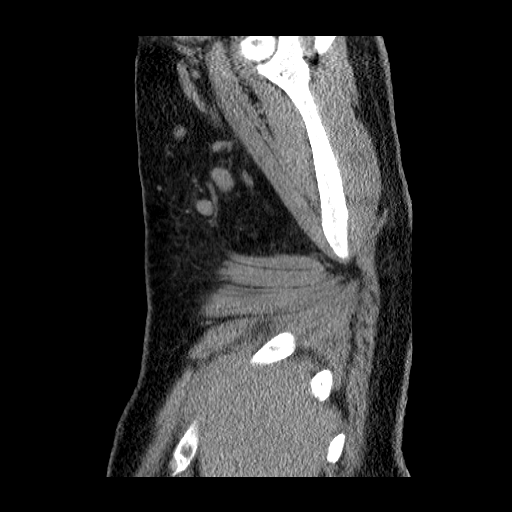
[im 34/153  mediastinal]
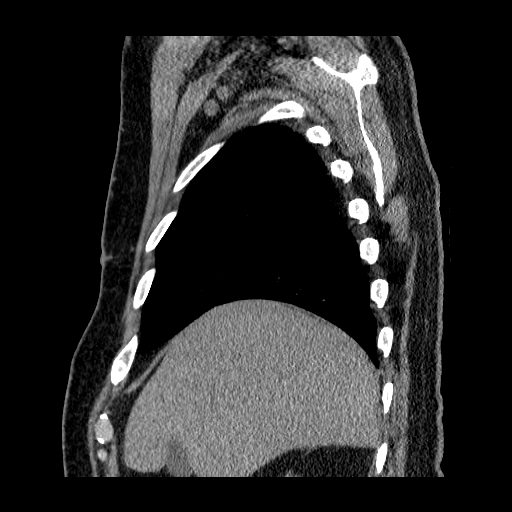

[16 of 30 positions shown; findings below may reference images not displayed]

FINDINGS: Cardiovascular: Heart size is mildly enlarged. There is no
significant pericardial fluid, thickening or pericardial
calcification. There is aortic atherosclerosis, as well as
atherosclerosis of the great vessels of the mediastinum and the
coronary arteries, including calcified atherosclerotic plaque in the
left main, left anterior descending and left circumflex coronary
arteries. Calcifications of the aortic valve.

Mediastinum/Nodes: Previously noted mildly enlarged left
paratracheal lymph node measuring 1.3 cm in short axis is similar to
the prior examination. Several other borderline enlarged mediastinal
lymph nodes are noted. Esophagus is unremarkable in appearance.
Several enlarged right axillary lymph nodes measuring up to 1.3 cm
in short axis (axial image 42 of series [DATE] x 2.2 cm nodule in
the posterior aspect of the right lobe of the thyroid gland is
similar to several prior examinations.

Lungs/Pleura: Previously noted nodule in the medial aspect of the
left upper lobe is stable in size measuring 2.8 x 2.4 cm (axial
image 41 of series 4). No other new suspicious appearing pulmonary
nodules or masses are noted. There is some patchy ground-glass
attenuation dependently in the right lower lobe which is nonspecific
and could reflect areas of mild inflammation. No confluent
consolidative airspace disease. No pleural effusions.

Upper Abdomen: Aortic atherosclerosis.

Musculoskeletal: There are no aggressive appearing lytic or blastic
lesions noted in the visualized portions of the skeleton.
IMPRESSION: 1. Previously noted left upper lobe pulmonary nodule and left
paratracheal lymphadenopathy are stable compared to the prior
examination. No new pulmonary nodules are noted.
2. Interval worsening of right axillary lymphadenopathy concerning
for recurrent disease in this patient with history of follicular
lymphoma.
3. Aortic atherosclerosis, in addition to left main and 2 vessel
coronary artery disease. Please note that although the presence of
coronary artery calcium documents the presence of coronary artery
disease, the severity of this disease and any potential stenosis
cannot be assessed on this non-gated CT examination. Assessment for
potential risk factor modification, dietary therapy or pharmacologic
therapy may be warranted, if clinically indicated.
4. There are calcifications of the aortic valve. Echocardiographic
correlation for evaluation of potential valvular dysfunction may be
warranted if clinically indicated.

Aortic Atherosclerosis ([id number]-C9W.W).

## 2019-03-03 DIAGNOSIS — E538 Deficiency of other specified B group vitamins: Secondary | ICD-10-CM | POA: Diagnosis not present

## 2019-03-06 DIAGNOSIS — G4733 Obstructive sleep apnea (adult) (pediatric): Secondary | ICD-10-CM | POA: Diagnosis not present

## 2019-03-13 ENCOUNTER — Other Ambulatory Visit: Payer: Self-pay

## 2019-03-13 ENCOUNTER — Ambulatory Visit (INDEPENDENT_AMBULATORY_CARE_PROVIDER_SITE_OTHER): Payer: PPO | Admitting: Cardiology

## 2019-03-13 ENCOUNTER — Encounter: Payer: Self-pay | Admitting: Cardiology

## 2019-03-13 VITALS — BP 112/72 | HR 95 | Ht 71.0 in | Wt 271.2 lb

## 2019-03-13 DIAGNOSIS — Z7901 Long term (current) use of anticoagulants: Secondary | ICD-10-CM | POA: Diagnosis not present

## 2019-03-13 DIAGNOSIS — Z86711 Personal history of pulmonary embolism: Secondary | ICD-10-CM | POA: Diagnosis not present

## 2019-03-13 DIAGNOSIS — R9439 Abnormal result of other cardiovascular function study: Secondary | ICD-10-CM

## 2019-03-13 DIAGNOSIS — I10 Essential (primary) hypertension: Secondary | ICD-10-CM

## 2019-03-13 DIAGNOSIS — Z1329 Encounter for screening for other suspected endocrine disorder: Secondary | ICD-10-CM | POA: Diagnosis not present

## 2019-03-13 DIAGNOSIS — I951 Orthostatic hypotension: Secondary | ICD-10-CM | POA: Insufficient documentation

## 2019-03-13 DIAGNOSIS — I4821 Permanent atrial fibrillation: Secondary | ICD-10-CM | POA: Diagnosis not present

## 2019-03-13 DIAGNOSIS — Z13220 Encounter for screening for lipoid disorders: Secondary | ICD-10-CM | POA: Diagnosis not present

## 2019-03-13 HISTORY — DX: Orthostatic hypotension: I95.1

## 2019-03-13 HISTORY — DX: Abnormal result of other cardiovascular function study: R94.39

## 2019-03-13 MED ORDER — DILTIAZEM HCL ER COATED BEADS 120 MG PO CP24: 120.0000 mg | ORAL_CAPSULE | Freq: Every day | ORAL | 1 refills | Status: DC

## 2019-03-13 NOTE — Patient Instructions (Signed)
Medication Instructions:  Your physician has recommended you make the following change in your medication:  DECREASE your Diltiazem to 120 mg once daily  *If you need a refill on your cardiac medications before your next appointment, please call your pharmacy*  Lab Work: Your physician recommends that you return for lab work in a few days: BMP, TSH, Liver panel, Lipid panel (please come in fasting)  If you have labs (blood work) drawn today and your tests are completely normal, you will receive your results only by: Marland Kitchen MyChart Message (if you have MyChart) OR . A paper copy in the mail If you have any lab test that is abnormal or we need to change your treatment, we will call you to review the results.  Testing/Procedures: None ordered  Follow-Up: At [facility], you and your health needs are our priority.  As part of our continuing mission to provide you with exceptional heart care, we have created designated Provider Care Teams.  These Care Teams include your primary Cardiologist (physician) and Advanced Practice Providers (APPs -  Physician Assistants and Nurse Practitioners) who all work together to provide you with the care you need, when you need it.  Your next appointment:   1 month(s)  The format for your next appointment:   In Person  Provider:   You will see  Sunny Schlein Revankar.  Or, you can be scheduled with the following Advanced Practice Provider on your designated Care Team (at our [facility]):  Laurann Montana, FNP

## 2019-03-13 NOTE — Progress Notes (Signed)
Cardiology Office Note:    Date:  03/13/2019   ID:  Edward Johnston, DOB April 18, 1950, MRN [id number]  PCP:  Angelina Sheriff, MD  Cardiologist:  Jenean Lindau, MD   Referring MD: Angelina Sheriff, MD    ASSESSMENT:    No diagnosis found. PLAN:    In order of problems listed above:  1. Abnormal nuclear stress test: I discussed my findings with the patient at extensive length.  He could not undergo CT coronary angiography because of atrial fibrillation.  I discussed invasive evaluation but is not keen on it.  He insists on continuing medical therapy and I respect his wishes. 2. Symptoms suggesting of postural hypotension: I have stopped his Cardizem and asked him to get a low-dose of 120 mg CT Cardizem and he is agreeable.  He will keep a track of his blood pressures.  He will come for blood work in the next few days which will include Chem-7 and fasting lipids and at that time he will tell us how he feels. 3. Permanent atrial fibrillation:I discussed with the patient atrial fibrillation, disease process. Management and therapy including rate and rhythm control, anticoagulation benefits and potential risks were discussed extensively with the patient. Patient had multiple questions which were answered to patient's satisfaction. 4. History of pulmonary embolism: On anticoagulation 5. Essential hypertension: As mentioned above. 6. Follow-up appointment in a month or earlier if he has any concerns.   Medication Adjustments/Labs and Tests Ordered: Current medicines are reviewed at length with the patient today.  Concerns regarding medicines are outlined above.  No orders of the defined types were placed in this encounter.  No orders of the defined types were placed in this encounter.    No chief complaint on file.    History of Present Illness:    Edward Johnston is a 68 y.o. male.  Patient has past medical history of lung mass, essential hypertension.  Patient has  persistent atrial fibrillation.  He was initiated on a high dose of Cardizem and now complains of dizziness and symptoms suggesting of postural hypotension.  No chest pain orthopnea or PND.  At the time of my evaluation, the patient is alert awake oriented and in no distress.  He also had an abnormal stress test.  Patient is on anticoagulation for pulmonary embolism  Past Medical History:  Diagnosis Date  . Anxiety   . Complication of anesthesia   . Depression   . Dysrhythmia   . Hypertension    due to psych med  . Incontinence   . Persistent atrial fibrillation ([place])   . PONV (postoperative nausea and vomiting)   . Shortness of breath dyspnea     Past Surgical History:  Procedure Laterality Date  .  aneurysm clip    . BRAIN SURGERY  1989   aneurysm clipping- [facility]  . JOINT REPLACEMENT    . LYMPH NODE BIOPSY N/A 11/16/2015   Procedure: Biopsy of node 7,4L and 10L;  Surgeon: Grace Isaac, MD;  Location: [place];  Service: Thoracic;  Laterality: N/A;  . PROSTATECTOMY    . REPLACEMENT TOTAL KNEE    . VIDEO BRONCHOSCOPY WITH ENDOBRONCHIAL ULTRASOUND N/A 11/16/2015   Procedure: VIDEO BRONCHOSCOPY WITH ENDOBRONCHIAL ULTRASOUND;  Surgeon: Grace Isaac, MD;  Location: [facility];  Service: Thoracic;  Laterality: N/A;    Current Medications: Current Meds  Medication Sig  . apixaban (ELIQUIS) 5 MG TABS tablet Take 5 mg by mouth 2 (two) times daily.  Marland Kitchen  ARIPiprazole (ABILIFY) 2 MG tablet Take 1 mg by mouth at bedtime.   Marland Kitchen aspirin EC 81 MG tablet Take 81 mg by mouth daily.  Marland Kitchen buPROPion (WELLBUTRIN XL) 300 MG 24 hr tablet Take 300 mg by mouth daily.   . clorazepate (TRANXENE) 7.5 MG tablet Take 3.75 mg by mouth 3 (three) times daily.   . clotrimazole-betamethasone (LOTRISONE) cream Apply 1 application topically as needed.  . diltiazem (TIAZAC) 300 MG 24 hr capsule TAKE 1 CAPSULE BY MOUTH EVERY DAY  . latanoprost (XALATAN) 0.005 % ophthalmic solution Place 1 drop into the left eye at bedtime.   . metoprolol succinate (TOPROL-XL) 100 MG 24 hr tablet TAKE 1 TABLET (100 MG TOTAL) BY MOUTH DAILY. TAKE WITH OR IMMEDIATELY FOLLOWING A MEAL.  Marland Kitchen omeprazole (PRILOSEC) 20 MG capsule Take 20 mg by mouth daily.  . Vilazodone HCl (VIIBRYD) 20 MG TABS Take 20 mg by mouth daily.      Allergies:   Digoxin   Social History   Socioeconomic History  . Marital status: Married    Spouse name: Not on file  . Number of children: Not on file  . Years of education: Not on file  . Highest education level: Not on file  Occupational History  . Not on file  Tobacco Use  . Smoking status: Never Smoker  . Smokeless tobacco: Never Used  Substance and Sexual Activity  . Alcohol use: No  . Drug use: No  . Sexual activity: Not Currently  Other Topics Concern  . Not on file  Social History Narrative  . Not on file   Social Determinants of Health   Financial Resource Strain:   . Difficulty of Paying Living Expenses: Not on file  Food Insecurity:   . Worried About Charity fundraiser in the Last Year: Not on file  . Ran Out of Food in the Last Year: Not on file  Transportation Needs:   . Lack of Transportation (Medical): Not on file  . Lack of Transportation (Non-Medical): Not on file  Physical Activity:   . Days of Exercise per Week: Not on file  . Minutes of Exercise per Session: Not on file  Stress:   . Feeling of Stress : Not on file  Social Connections:   . Frequency of Communication with Friends and Family: Not on file  . Frequency of Social Gatherings with Friends and Family: Not on file  . Attends Religious Services: Not on file  . Active Member of Clubs or Organizations: Not on file  . Attends Archivist Meetings: Not on file  . Marital Status: Not on file     Family History: The patient's family history includes Breast cancer in his mother; Depression in his sister; Diabetes in his sister; Heart attack in his father; Heart disease in his sister.  ROS:   Please see  the history of present illness.    All other systems reviewed and are negative.  EKGs/Labs/Other Studies Reviewed:    The following studies were reviewed today: Study Highlights    There was no ST segment deviation noted during stress.  No T wave inversion was noted during stress.  Defect 1: There is a small defect of mild severity present in the mid anterior and apical anterior location.  Findings consistent with ischemia.  This is an intermediate risk study.   Study not gated.  Unable to determine LV systolic function.  Cannot determine if there is evidence of wall motion abnormalities.  However  based on imaging, there is mild defect in the anterior and the apical anterior walls which is reversible and consistent with myocardial ischemia.  Please clinically correlate need for further diagnostic testing and treatment plan.   IMPRESSIONS    1. Left ventricular ejection fraction, by visual estimation, is 45 to 50%. The left ventricle has normal function. There is mildly increased left ventricular hypertrophy.  2. Left ventricular diastolic parameters are indeterminate.  3. Left atrial size was normal.  4. The mitral valve is normal in structure. No evidence of mitral valve regurgitation. No evidence of mitral stenosis.  5. The tricuspid valve is normal in structure. Tricuspid valve regurgitation is not demonstrated.  6. Aortic valve regurgitation is trivial. No evidence of aortic valve sclerosis or stenosis.    Recent Labs: 11/13/2018: TSH 2.700 01/06/2019: BUN 11; Creatinine, Ser 1.01; Potassium 4.5; Sodium 142  Recent Lipid Panel No results found for: CHOL, TRIG, HDL, CHOLHDL, VLDL, LDLCALC, LDLDIRECT  Physical Exam:    VS:  BP 112/72 (BP Location: Left Arm)   Pulse 95   Ht 5\' 11"  (1.803 m)   Wt 271 lb 3.2 oz (123 kg)   SpO2 100%   BMI 37.82 kg/m     Wt Readings from Last 3 Encounters:  03/13/19 271 lb 3.2 oz (123 kg)  01/28/19 270 lb (122.5 kg)  01/06/19 270  lb 9.6 oz (122.7 kg)     GEN: Patient is in no acute distress HEENT: Normal NECK: No JVD; No carotid bruits LYMPHATICS: No lymphadenopathy CARDIAC: Hear sounds regular, 2/6 systolic murmur at the apex. RESPIRATORY:  Clear to auscultation without rales, wheezing or rhonchi  ABDOMEN: Soft, non-tender, non-distended MUSCULOSKELETAL:  No edema; No deformity  SKIN: Warm and dry NEUROLOGIC:  Alert and oriented x 3 PSYCHIATRIC:  Normal affect   Signed, Jenean Lindau, MD  03/13/2019 3:53 PM    [facility]

## 2019-03-18 DIAGNOSIS — R9439 Abnormal result of other cardiovascular function study: Secondary | ICD-10-CM | POA: Diagnosis not present

## 2019-03-18 DIAGNOSIS — Z13220 Encounter for screening for lipoid disorders: Secondary | ICD-10-CM | POA: Diagnosis not present

## 2019-03-18 DIAGNOSIS — Z7901 Long term (current) use of anticoagulants: Secondary | ICD-10-CM | POA: Diagnosis not present

## 2019-03-18 DIAGNOSIS — I4821 Permanent atrial fibrillation: Secondary | ICD-10-CM | POA: Diagnosis not present

## 2019-03-18 DIAGNOSIS — I10 Essential (primary) hypertension: Secondary | ICD-10-CM | POA: Diagnosis not present

## 2019-03-18 DIAGNOSIS — Z86711 Personal history of pulmonary embolism: Secondary | ICD-10-CM | POA: Diagnosis not present

## 2019-03-18 DIAGNOSIS — Z1329 Encounter for screening for other suspected endocrine disorder: Secondary | ICD-10-CM | POA: Diagnosis not present

## 2019-03-18 DIAGNOSIS — I951 Orthostatic hypotension: Secondary | ICD-10-CM | POA: Diagnosis not present

## 2019-03-18 LAB — LIPID PANEL
Chol/HDL Ratio: 5.2 ratio — ABNORMAL HIGH (ref 0.0–5.0)
Cholesterol, Total: 170 mg/dL (ref 100–199)
HDL: 33 mg/dL — ABNORMAL LOW (ref >39)
LDL Chol Calc (NIH): 110 mg/dL — ABNORMAL HIGH (ref 0–99)
Triglycerides: 149 mg/dL (ref 0–149)
VLDL Cholesterol Cal: 27 mg/dL (ref 5–40)

## 2019-03-18 LAB — BASIC METABOLIC PANEL
BUN/Creatinine Ratio: 11 (ref 10–24)
BUN: 13 mg/dL (ref 8–27)
CO2: 25 mmol/L (ref 20–29)
Calcium: 10 mg/dL (ref 8.6–10.2)
Chloride: 100 mmol/L (ref 96–106)
Creatinine, Ser: 1.15 mg/dL (ref 0.76–1.27)
GFR calc Af Amer: 74 mL/min/{1.73_m2} (ref >59)
GFR calc non Af Amer: 64 mL/min/{1.73_m2} (ref >59)
Glucose: 145 mg/dL — ABNORMAL HIGH (ref 65–99)
Potassium: 4 mmol/L (ref 3.5–5.2)
Sodium: 142 mmol/L (ref 134–144)

## 2019-03-18 LAB — TSH: TSH: 3.88 u[IU]/mL (ref 0.450–4.500)

## 2019-03-24 ENCOUNTER — Encounter: Payer: Self-pay | Admitting: *Deleted

## 2019-03-24 ENCOUNTER — Telehealth: Payer: Self-pay | Admitting: *Deleted

## 2019-03-24 NOTE — Telephone Encounter (Signed)
Telephone call to patient. Left message that labs were unremarkable and to call with any questions. Copy sent to PCP

## 2019-03-24 NOTE — Telephone Encounter (Signed)
-----   Message from Jenean Lindau, MD sent at 03/18/2019  4:49 PM EST ----- The results of the study is unremarkable. Please inform patient. I will discuss in detail at next appointment. Cc  primary care/referring physician Jenean Lindau, MD 03/18/2019 4:49 PM

## 2019-03-31 DIAGNOSIS — R918 Other nonspecific abnormal finding of lung field: Secondary | ICD-10-CM | POA: Diagnosis not present

## 2019-03-31 DIAGNOSIS — R591 Generalized enlarged lymph nodes: Secondary | ICD-10-CM | POA: Diagnosis not present

## 2019-03-31 DIAGNOSIS — R590 Localized enlarged lymph nodes: Secondary | ICD-10-CM | POA: Diagnosis not present

## 2019-03-31 DIAGNOSIS — I700 Atherosclerosis of aorta: Secondary | ICD-10-CM | POA: Diagnosis not present

## 2019-04-05 ENCOUNTER — Other Ambulatory Visit: Payer: Self-pay | Admitting: Cardiology

## 2019-04-07 DIAGNOSIS — D519 Vitamin B12 deficiency anemia, unspecified: Secondary | ICD-10-CM | POA: Diagnosis not present

## 2019-04-08 DIAGNOSIS — C8305 Small cell B-cell lymphoma, lymph nodes of inguinal region and lower limb: Secondary | ICD-10-CM | POA: Diagnosis not present

## 2019-04-08 DIAGNOSIS — R918 Other nonspecific abnormal finding of lung field: Secondary | ICD-10-CM | POA: Diagnosis not present

## 2019-04-08 DIAGNOSIS — C8295 Follicular lymphoma, unspecified, lymph nodes of inguinal region and lower limb: Secondary | ICD-10-CM | POA: Diagnosis not present

## 2019-04-14 ENCOUNTER — Other Ambulatory Visit: Payer: Self-pay

## 2019-04-14 ENCOUNTER — Encounter: Payer: Self-pay | Admitting: Cardiology

## 2019-04-14 ENCOUNTER — Ambulatory Visit (INDEPENDENT_AMBULATORY_CARE_PROVIDER_SITE_OTHER): Payer: PPO | Admitting: Cardiology

## 2019-04-14 VITALS — BP 134/88 | HR 82 | Ht 72.0 in | Wt 268.2 lb

## 2019-04-14 DIAGNOSIS — Z7901 Long term (current) use of anticoagulants: Secondary | ICD-10-CM

## 2019-04-14 DIAGNOSIS — R9439 Abnormal result of other cardiovascular function study: Secondary | ICD-10-CM

## 2019-04-14 DIAGNOSIS — Z86711 Personal history of pulmonary embolism: Secondary | ICD-10-CM | POA: Diagnosis not present

## 2019-04-14 DIAGNOSIS — I4821 Permanent atrial fibrillation: Secondary | ICD-10-CM

## 2019-04-14 MED ORDER — APIXABAN 5 MG PO TABS: 5.0000 mg | ORAL_TABLET | Freq: Two times a day (BID) | ORAL | 2 refills | Status: AC

## 2019-04-14 MED ORDER — METOPROLOL SUCCINATE ER 100 MG PO TB24: 100.0000 mg | ORAL_TABLET | Freq: Every day | ORAL | 2 refills | Status: DC

## 2019-04-14 MED ORDER — DILTIAZEM HCL ER COATED BEADS 120 MG PO CP24: 120.0000 mg | ORAL_CAPSULE | Freq: Every day | ORAL | 2 refills | Status: DC

## 2019-04-14 NOTE — Progress Notes (Signed)
Cardiology Office Note:    Date:  04/14/2019   ID:  Edward Johnston, DOB 1950-06-14, MRN [id number]  PCP:  Angelina Sheriff, MD  Cardiologist:  Jenean Lindau, MD   Referring MD: Angelina Sheriff, MD    ASSESSMENT:    1. Permanent atrial fibrillation ([place])   2. Current use of long term anticoagulation   3. Abnormal nuclear stress test   4. History of pulmonary embolism    PLAN:    In order of problems listed above:  1. Permanent atrial fibrillation:I discussed with the patient atrial fibrillation, disease process. Management and therapy including rate and rhythm control, anticoagulation benefits and potential risks were discussed extensively with the patient. Patient had multiple questions which were answered to patient's satisfaction. 2. Essential hypertension: Patient was feeling lightheaded so his calcium channel blocker dose was cut into half and he feels much better and the symptoms are resolved 3. Mixed dyslipidemia: Lipids followed by primary care physician. 4. Patient will be seen in follow-up appointment in 6 months or earlier if the patient has any concerns    Medication Adjustments/Labs and Tests Ordered: Current medicines are reviewed at length with the patient today.  Concerns regarding medicines are outlined above.  No orders of the defined types were placed in this encounter.  No orders of the defined types were placed in this encounter.    No chief complaint on file.    History of Present Illness:    Edward Johnston is a 69 y.o. male.  Patient has past medical history of essential hypertension, permanent atrial fibrillation and history of pulmonary embolism.  He is on anticoagulation.  He denies any problems at this time and takes care of activities of daily living.  No chest pain orthopnea or PND.  Was feeling lightheaded at times and I cut down his calcium channel blocker dose into half and he feels much better.  At the time of my evaluation, the  patient is alert awake oriented and in no distress.  Past Medical History:  Diagnosis Date  . Anxiety   . Complication of anesthesia   . Depression   . Dysrhythmia   . Hypertension    due to psych med  . Incontinence   . Persistent atrial fibrillation ([place])   . PONV (postoperative nausea and vomiting)   . Shortness of breath dyspnea     Past Surgical History:  Procedure Laterality Date  .  aneurysm clip    . BRAIN SURGERY  1989   aneurysm clipping- [facility]  . JOINT REPLACEMENT    . LYMPH NODE BIOPSY N/A 11/16/2015   Procedure: Biopsy of node 7,4L and 10L;  Surgeon: Grace Isaac, MD;  Location: [place];  Service: Thoracic;  Laterality: N/A;  . PROSTATECTOMY    . REPLACEMENT TOTAL KNEE    . VIDEO BRONCHOSCOPY WITH ENDOBRONCHIAL ULTRASOUND N/A 11/16/2015   Procedure: VIDEO BRONCHOSCOPY WITH ENDOBRONCHIAL ULTRASOUND;  Surgeon: Grace Isaac, MD;  Location: [facility];  Service: Thoracic;  Laterality: N/A;    Current Medications: Current Meds  Medication Sig  . apixaban (ELIQUIS) 5 MG TABS tablet Take 5 mg by mouth 2 (two) times daily.  . ARIPiprazole (ABILIFY) 2 MG tablet Take 1 mg by mouth at bedtime.   Marland Kitchen aspirin EC 81 MG tablet Take 81 mg by mouth daily.  Marland Kitchen buPROPion (WELLBUTRIN XL) 300 MG 24 hr tablet Take 300 mg by mouth daily.   . clorazepate (TRANXENE) 3.75 MG tablet Take 3.75  mg by mouth every 6 (six) hours.  . clotrimazole-betamethasone (LOTRISONE) cream Apply 1 application topically as needed.  . diltiazem (CARDIZEM CD) 120 MG 24 hr capsule Take 1 capsule (120 mg total) by mouth daily.  Marland Kitchen latanoprost (XALATAN) 0.005 % ophthalmic solution Place 1 drop into the left eye at bedtime.  . metoprolol succinate (TOPROL-XL) 100 MG 24 hr tablet TAKE 1 TABLET (100 MG TOTAL) BY MOUTH DAILY. TAKE WITH OR IMMEDIATELY FOLLOWING A MEAL.  Marland Kitchen omeprazole (PRILOSEC) 20 MG capsule Take 20 mg by mouth daily.  . Vilazodone HCl (VIIBRYD) 20 MG TABS Take 20 mg by mouth daily.   . [DISCONTINUED]  clorazepate (TRANXENE) 7.5 MG tablet Take 3.75 mg by mouth 3 (three) times daily.   . [DISCONTINUED] diltiazem (TIAZAC) 120 MG 24 hr capsule Take by mouth.     Allergies:   Digoxin   Social History   Socioeconomic History  . Marital status: Married    Spouse name: Not on file  . Number of children: Not on file  . Years of education: Not on file  . Highest education level: Not on file  Occupational History  . Not on file  Tobacco Use  . Smoking status: Never Smoker  . Smokeless tobacco: Never Used  Substance and Sexual Activity  . Alcohol use: No  . Drug use: No  . Sexual activity: Not Currently  Other Topics Concern  . Not on file  Social History Narrative  . Not on file   Social Determinants of Health   Financial Resource Strain:   . Difficulty of Paying Living Expenses: Not on file  Food Insecurity:   . Worried About Charity fundraiser in the Last Year: Not on file  . Ran Out of Food in the Last Year: Not on file  Transportation Needs:   . Lack of Transportation (Medical): Not on file  . Lack of Transportation (Non-Medical): Not on file  Physical Activity:   . Days of Exercise per Week: Not on file  . Minutes of Exercise per Session: Not on file  Stress:   . Feeling of Stress : Not on file  Social Connections:   . Frequency of Communication with Friends and Family: Not on file  . Frequency of Social Gatherings with Friends and Family: Not on file  . Attends Religious Services: Not on file  . Active Member of Clubs or Organizations: Not on file  . Attends Archivist Meetings: Not on file  . Marital Status: Not on file     Family History: The patient's family history includes Breast cancer in his mother; Depression in his sister; Diabetes in his sister; Heart attack in his father; Heart disease in his sister.  ROS:   Please see the history of present illness.    All other systems reviewed and are negative.  EKGs/Labs/Other Studies Reviewed:     The following studies were reviewed today: IMPRESSIONS    1. Left ventricular ejection fraction, by visual estimation, is 45 to 50%. The left ventricle has normal function. There is mildly increased left ventricular hypertrophy.  2. Left ventricular diastolic parameters are indeterminate.  3. Left atrial size was normal.  4. The mitral valve is normal in structure. No evidence of mitral valve regurgitation. No evidence of mitral stenosis.  5. The tricuspid valve is normal in structure. Tricuspid valve regurgitation is not demonstrated.  6. Aortic valve regurgitation is trivial. No evidence of aortic valve sclerosis or stenosis.   Recent Labs: 03/18/2019:  BUN 13; Creatinine, Ser 1.15; Potassium 4.0; Sodium 142; TSH 3.880  Recent Lipid Panel    Component Value Date/Time   CHOL 170 03/18/2019 1003   TRIG 149 03/18/2019 1003   HDL 33 (L) 03/18/2019 1003   CHOLHDL 5.2 (H) 03/18/2019 1003   LDLCALC 110 (H) 03/18/2019 1003    Physical Exam:    VS:  BP 134/88 (BP Location: Left Arm, Patient Position: Sitting, Cuff Size: Normal)   Pulse 82   Ht 6' (1.829 m)   Wt 268 lb 3.2 oz (121.7 kg)   SpO2 100%   BMI 36.37 kg/m     Wt Readings from Last 3 Encounters:  04/14/19 268 lb 3.2 oz (121.7 kg)  03/13/19 271 lb 3.2 oz (123 kg)  01/28/19 270 lb (122.5 kg)     GEN: Patient is in no acute distress HEENT: Normal NECK: No JVD; No carotid bruits LYMPHATICS: No lymphadenopathy CARDIAC: Hear sounds regular, 2/6 systolic murmur at the apex. RESPIRATORY:  Clear to auscultation without rales, wheezing or rhonchi  ABDOMEN: Soft, non-tender, non-distended MUSCULOSKELETAL:  No edema; No deformity  SKIN: Warm and dry NEUROLOGIC:  Alert and oriented x 3 PSYCHIATRIC:  Normal affect   Signed, Jenean Lindau, MD  04/14/2019 11:27 AM    [place]

## 2019-04-14 NOTE — Addendum Note (Signed)
Addended by: Beckey Rutter on: 04/14/2019 11:44 AM   Modules accepted: Orders

## 2019-04-14 NOTE — Patient Instructions (Signed)

## 2019-04-16 DIAGNOSIS — M7711 Lateral epicondylitis, right elbow: Secondary | ICD-10-CM | POA: Diagnosis not present

## 2019-04-22 NOTE — Addendum Note (Signed)
Addended by: Beckey Rutter on: 04/22/2019 03:01 PM   Modules accepted: Orders

## 2019-04-28 DIAGNOSIS — B351 Tinea unguium: Secondary | ICD-10-CM | POA: Diagnosis not present

## 2019-04-28 DIAGNOSIS — L853 Xerosis cutis: Secondary | ICD-10-CM | POA: Diagnosis not present

## 2019-04-28 DIAGNOSIS — L570 Actinic keratosis: Secondary | ICD-10-CM | POA: Diagnosis not present

## 2019-04-28 DIAGNOSIS — C44329 Squamous cell carcinoma of skin of other parts of face: Secondary | ICD-10-CM | POA: Diagnosis not present

## 2019-05-05 DIAGNOSIS — D519 Vitamin B12 deficiency anemia, unspecified: Secondary | ICD-10-CM | POA: Diagnosis not present

## 2019-05-06 DIAGNOSIS — C44329 Squamous cell carcinoma of skin of other parts of face: Secondary | ICD-10-CM | POA: Diagnosis not present

## 2019-06-30 DIAGNOSIS — D510 Vitamin B12 deficiency anemia due to intrinsic factor deficiency: Secondary | ICD-10-CM | POA: Diagnosis not present

## 2019-07-07 DIAGNOSIS — F330 Major depressive disorder, recurrent, mild: Secondary | ICD-10-CM | POA: Diagnosis not present

## 2019-07-07 DIAGNOSIS — F411 Generalized anxiety disorder: Secondary | ICD-10-CM | POA: Diagnosis not present

## 2019-07-23 DIAGNOSIS — G4733 Obstructive sleep apnea (adult) (pediatric): Secondary | ICD-10-CM | POA: Diagnosis not present

## 2019-07-25 DIAGNOSIS — C8305 Small cell B-cell lymphoma, lymph nodes of inguinal region and lower limb: Secondary | ICD-10-CM | POA: Diagnosis not present

## 2019-07-25 DIAGNOSIS — R599 Enlarged lymph nodes, unspecified: Secondary | ICD-10-CM | POA: Diagnosis not present

## 2019-07-25 DIAGNOSIS — R918 Other nonspecific abnormal finding of lung field: Secondary | ICD-10-CM | POA: Diagnosis not present

## 2019-07-25 DIAGNOSIS — R590 Localized enlarged lymph nodes: Secondary | ICD-10-CM | POA: Diagnosis not present

## 2019-07-25 DIAGNOSIS — E041 Nontoxic single thyroid nodule: Secondary | ICD-10-CM | POA: Diagnosis not present

## 2019-07-25 DIAGNOSIS — R911 Solitary pulmonary nodule: Secondary | ICD-10-CM | POA: Diagnosis not present

## 2019-07-25 DIAGNOSIS — I700 Atherosclerosis of aorta: Secondary | ICD-10-CM | POA: Diagnosis not present

## 2019-07-28 DIAGNOSIS — Z7901 Long term (current) use of anticoagulants: Secondary | ICD-10-CM | POA: Diagnosis not present

## 2019-07-28 DIAGNOSIS — I700 Atherosclerosis of aorta: Secondary | ICD-10-CM | POA: Diagnosis not present

## 2019-07-28 DIAGNOSIS — E041 Nontoxic single thyroid nodule: Secondary | ICD-10-CM | POA: Diagnosis not present

## 2019-07-28 DIAGNOSIS — I2510 Atherosclerotic heart disease of native coronary artery without angina pectoris: Secondary | ICD-10-CM | POA: Diagnosis not present

## 2019-07-28 DIAGNOSIS — K760 Fatty (change of) liver, not elsewhere classified: Secondary | ICD-10-CM | POA: Diagnosis not present

## 2019-07-28 DIAGNOSIS — Z79899 Other long term (current) drug therapy: Secondary | ICD-10-CM | POA: Diagnosis not present

## 2019-07-28 DIAGNOSIS — R590 Localized enlarged lymph nodes: Secondary | ICD-10-CM | POA: Diagnosis not present

## 2019-07-28 DIAGNOSIS — C8305 Small cell B-cell lymphoma, lymph nodes of inguinal region and lower limb: Secondary | ICD-10-CM | POA: Diagnosis not present

## 2019-07-31 DIAGNOSIS — D510 Vitamin B12 deficiency anemia due to intrinsic factor deficiency: Secondary | ICD-10-CM | POA: Diagnosis not present

## 2019-08-04 ENCOUNTER — Other Ambulatory Visit: Payer: Self-pay

## 2019-08-05 ENCOUNTER — Ambulatory Visit: Payer: PPO | Admitting: Cardiology

## 2019-08-05 ENCOUNTER — Encounter: Payer: Self-pay | Admitting: Cardiology

## 2019-08-05 ENCOUNTER — Other Ambulatory Visit: Payer: Self-pay

## 2019-08-05 VITALS — BP 118/78 | HR 72 | Ht 72.0 in | Wt 259.0 lb

## 2019-08-05 DIAGNOSIS — I4821 Permanent atrial fibrillation: Secondary | ICD-10-CM | POA: Diagnosis not present

## 2019-08-05 DIAGNOSIS — Z86711 Personal history of pulmonary embolism: Secondary | ICD-10-CM

## 2019-08-05 DIAGNOSIS — I4892 Unspecified atrial flutter: Secondary | ICD-10-CM | POA: Diagnosis not present

## 2019-08-05 NOTE — Progress Notes (Signed)
Cardiology Office Note:    Date:  08/05/2019   ID:  Edward Johnston, DOB July 20, 1950, MRN [id number]  PCP:  Angelina Sheriff, MD  Cardiologist:  Jenean Lindau, MD   Referring MD: Angelina Sheriff, MD    ASSESSMENT:    1. Permanent atrial fibrillation ([place])   2. Atrial flutter, paroxysmal ([place])   3. History of pulmonary embolism    PLAN:    In order of problems listed above:  1. Permanent atrial fibrillation:I discussed with the patient atrial fibrillation, disease process. Management and therapy including rate and rhythm control, anticoagulation benefits and potential risks were discussed extensively with the patient. Patient had multiple questions which were answered to patient's satisfaction. 2. History of pulmonary embolism: Patient is on anticoagulation and tolerating well 3. History of cancer followed by his oncologist and a recent review was unremarkable and stable.  Blood work was obtained by oncologist and patient was told it was stable. 4. Patient will be seen in follow-up appointment in 6 months or earlier if the patient has any concerns    Medication Adjustments/Labs and Tests Ordered: Current medicines are reviewed at length with the patient today.  Concerns regarding medicines are outlined above.  No orders of the defined types were placed in this encounter.  No orders of the defined types were placed in this encounter.    Chief Complaint  Patient presents with  . Follow-up    3 Months     History of Present Illness:    Edward Johnston is a 69 y.o. male.  Patient has past medical history of atrial fibrillation which is permanent, history of pulmonary embolism and cancer.  Patient is followed by oncologist.  He denies any problems at this time.  His only issues of weakness.  He sweats oncologist yesterday and got complete blood work.  No chest pain orthopnea or PND.  At the time of my evaluation, the patient is alert awake oriented and in no distress.   His wife accompanies him for this visit.  Past Medical History:  Diagnosis Date  . Abdominal lymphadenopathy 01/31/2014  . Abnormal nuclear stress test 03/13/2019  . Acute saddle pulmonary embolism with acute cor pulmonale ([place]) 06/17/2015  . Anxiety   . Anxiety and depression 11/09/2015  . Atrial flutter, paroxysmal ([place]) 09/13/2015  . Complication of anesthesia   . Current use of long term anticoagulation 09/20/2017  . Depression   . Dysrhythmia   . ED (erectile dysfunction) of organic origin 01/31/2014  . Elevated prostate specific antigen (PSA) 01/31/2014  . Hematuria 01/31/2014  . Hilar mass 10/08/2015  . History of cerebral aneurysm repair 06/17/2015  . History of prostate cancer 06/17/2015  . History of pulmonary embolism 09/13/2015  . Hypertension    due to psych med  . Hypertrophy of prostate with urinary obstruction and other lower urinary tract symptoms (LUTS) 08/18/2013  . Incontinence   . Inguinal adenopathy 10/06/2015  . Major depressive disorder without psychotic features 02/04/2014  . Major depressive disorder, recurrent episode, moderate ([place]) 01/16/2014  . Malignant neoplasm of prostate ([place]) 08/18/2013  . Mobility impaired 07/26/2017  . Permanent atrial fibrillation ([place]) 09/20/2017  . Persistent atrial fibrillation ([place])   . PONV (postoperative nausea and vomiting)   . Postural hypotension 03/13/2019  . Severe major depression without psychotic features ([place]) 02/03/2014  . Shortness of breath dyspnea   . Sleep apnea 07/26/2017   Overview:  no CPAP  Formatting of this note might be different from  the original. Overview:  no CPAP Overview:  Overview:  no CPAP Formatting of this note might be different from the original. no CPAP  . Small B-cell lymphoma of lymph nodes of inguinal region ([place]) 10/10/2015   Overview:  10/04/2015 Molecular Pathology [id number] Monoclonl DC10+ B-cell population (~42% of lymphocytes) compatible with malignant B-cell lymphoma - with light scatter  properties suggestive of small to intermediate cell size.  By itself, a CD10+ phenotype is not entirely specific for a lymphoma subtype, and flow cytometry cannot distinguish follicular from diffuse lymphomas. ... Based on our int  . SUI (stress urinary incontinence), male 12/24/2013  . Weakness generalized 07/26/2017    Past Surgical History:  Procedure Laterality Date  .  aneurysm clip    . BRAIN SURGERY  1989   aneurysm clipping- [facility]  . JOINT REPLACEMENT    . LYMPH NODE BIOPSY N/A 11/16/2015   Procedure: Biopsy of node 7,4L and 10L;  Surgeon: Grace Isaac, MD;  Location: [facility];  Service: Thoracic;  Laterality: N/A;  . PROSTATECTOMY    . REPLACEMENT TOTAL KNEE    . VIDEO BRONCHOSCOPY WITH ENDOBRONCHIAL ULTRASOUND N/A 11/16/2015   Procedure: VIDEO BRONCHOSCOPY WITH ENDOBRONCHIAL ULTRASOUND;  Surgeon: Grace Isaac, MD;  [facility];  Service: Thoracic;  Laterality: N/A;    Current Medications: Current Meds  Medication Sig  . apixaban (ELIQUIS) 5 MG TABS tablet Take 1 tablet (5 mg total) by mouth 2 (two) times daily.  . ARIPiprazole (ABILIFY) 2 MG tablet Take 1 mg by mouth at bedtime.   Marland Kitchen aspirin EC 81 MG tablet Take 81 mg by mouth daily.  Marland Kitchen buPROPion (WELLBUTRIN XL) 150 MG 24 hr tablet Take 150 mg by mouth daily.  . clorazepate (TRANXENE) 3.75 MG tablet Take 3.75 mg by mouth every 6 (six) hours.  . clotrimazole-betamethasone (LOTRISONE) cream Apply 1 application topically as needed.  . diltiazem (CARDIZEM CD) 120 MG 24 hr capsule Take 1 capsule (120 mg total) by mouth daily.  Marland Kitchen diltiazem (TIAZAC) 120 MG 24 hr capsule Take 120 mg by mouth daily.  Marland Kitchen latanoprost (XALATAN) 0.005 % ophthalmic solution Place 1 drop into the left eye at bedtime.  Marland Kitchen omeprazole (PRILOSEC) 20 MG capsule Take 20 mg by mouth daily.  Marland Kitchen terbinafine (LAMISIL) 250 MG tablet Take 250 mg by mouth with breakfast, with lunch, and with evening meal.  . traMADol (ULTRAM) 50 MG tablet Take 50 mg by mouth daily as  needed.  . Vilazodone HCl (VIIBRYD) 20 MG TABS Take 20 mg by mouth daily.      Allergies:   Digoxin   Social History   Socioeconomic History  . Marital status: Married    Spouse name: Not on file  . Number of children: Not on file  . Years of education: Not on file  . Highest education level: Not on file  Occupational History  . Not on file  Tobacco Use  . Smoking status: Never Smoker  . Smokeless tobacco: Never Used  Substance and Sexual Activity  . Alcohol use: No  . Drug use: No  . Sexual activity: Not Currently  Other Topics Concern  . Not on file  Social History Narrative  . Not on file   Social Determinants of Health   Financial Resource Strain:   . Difficulty of Paying Living Expenses:   Food Insecurity:   . Worried About Charity fundraiser in the Last Year:   . [place] in the Last Year:  Transportation Needs:   . Film/video editor (Medical):   Marland Kitchen Lack of Transportation (Non-Medical):   Physical Activity:   . Days of Exercise per Week:   . Minutes of Exercise per Session:   Stress:   . Feeling of Stress :   Social Connections:   . Frequency of Communication with Friends and Family:   . Frequency of Social Gatherings with Friends and Family:   . Attends Religious Services:   . Active Member of Clubs or Organizations:   . Attends Archivist Meetings:   Marland Kitchen Marital Status:      Family History: The patient's family history includes Breast cancer in his mother; Depression in his sister; Diabetes in his sister; Heart attack in his father; Heart disease in his sister.  ROS:   Please see the history of present illness.    All other systems reviewed and are negative.  EKGs/Labs/Other Studies Reviewed:    The following studies were reviewed today: I discussed my findings with the patient at length   Recent Labs: 03/18/2019: BUN 13; Creatinine, Ser 1.15; Potassium 4.0; Sodium 142; TSH 3.880  Recent Lipid Panel    Component Value  Date/Time   CHOL 170 03/18/2019 1003   TRIG 149 03/18/2019 1003   HDL 33 (L) 03/18/2019 1003   CHOLHDL 5.2 (H) 03/18/2019 1003   LDLCALC 110 (H) 03/18/2019 1003    Physical Exam:    VS:  BP 118/78   Pulse 72   Ht 6' (1.829 m)   Wt 259 lb (117.5 kg)   SpO2 97%   BMI 35.13 kg/m     Wt Readings from Last 3 Encounters:  08/05/19 259 lb (117.5 kg)  04/14/19 268 lb 3.2 oz (121.7 kg)  03/13/19 271 lb 3.2 oz (123 kg)     GEN: Patient is in no acute distress HEENT: Normal NECK: No JVD; No carotid bruits LYMPHATICS: No lymphadenopathy CARDIAC: Hear sounds irregular, 2/6 systolic murmur at the apex. RESPIRATORY:  Clear to auscultation without rales, wheezing or rhonchi  ABDOMEN: Soft, non-tender, non-distended MUSCULOSKELETAL:  No edema; No deformity  SKIN: Warm and dry NEUROLOGIC:  Alert and oriented x 3 PSYCHIATRIC:  Normal affect   Signed, Jenean Lindau, MD  08/05/2019 3:31 PM    [place]

## 2019-08-05 NOTE — Patient Instructions (Signed)

## 2019-08-12 DIAGNOSIS — G4733 Obstructive sleep apnea (adult) (pediatric): Secondary | ICD-10-CM | POA: Diagnosis not present

## 2019-08-12 DIAGNOSIS — R918 Other nonspecific abnormal finding of lung field: Secondary | ICD-10-CM | POA: Diagnosis not present

## 2019-08-12 DIAGNOSIS — R0600 Dyspnea, unspecified: Secondary | ICD-10-CM | POA: Diagnosis not present

## 2019-08-12 DIAGNOSIS — R0982 Postnasal drip: Secondary | ICD-10-CM | POA: Diagnosis not present

## 2019-08-22 DIAGNOSIS — G4733 Obstructive sleep apnea (adult) (pediatric): Secondary | ICD-10-CM | POA: Diagnosis not present

## 2019-08-25 DIAGNOSIS — H2513 Age-related nuclear cataract, bilateral: Secondary | ICD-10-CM | POA: Diagnosis not present

## 2019-08-25 DIAGNOSIS — H401122 Primary open-angle glaucoma, left eye, moderate stage: Secondary | ICD-10-CM | POA: Diagnosis not present

## 2019-08-25 DIAGNOSIS — H02004 Unspecified entropion of left upper eyelid: Secondary | ICD-10-CM | POA: Diagnosis not present

## 2019-08-26 DIAGNOSIS — C44319 Basal cell carcinoma of skin of other parts of face: Secondary | ICD-10-CM | POA: Diagnosis not present

## 2019-08-26 DIAGNOSIS — L570 Actinic keratosis: Secondary | ICD-10-CM | POA: Diagnosis not present

## 2019-08-26 DIAGNOSIS — L578 Other skin changes due to chronic exposure to nonionizing radiation: Secondary | ICD-10-CM | POA: Diagnosis not present

## 2019-08-26 DIAGNOSIS — C44329 Squamous cell carcinoma of skin of other parts of face: Secondary | ICD-10-CM | POA: Diagnosis not present

## 2019-09-02 DIAGNOSIS — C44319 Basal cell carcinoma of skin of other parts of face: Secondary | ICD-10-CM | POA: Diagnosis not present

## 2019-09-05 DIAGNOSIS — E538 Deficiency of other specified B group vitamins: Secondary | ICD-10-CM | POA: Diagnosis not present

## 2019-09-26 DIAGNOSIS — G4733 Obstructive sleep apnea (adult) (pediatric): Secondary | ICD-10-CM | POA: Diagnosis not present

## 2019-09-29 DIAGNOSIS — D519 Vitamin B12 deficiency anemia, unspecified: Secondary | ICD-10-CM | POA: Diagnosis not present

## 2019-10-07 DIAGNOSIS — Z6835 Body mass index (BMI) 35.0-35.9, adult: Secondary | ICD-10-CM | POA: Diagnosis not present

## 2019-10-07 DIAGNOSIS — R918 Other nonspecific abnormal finding of lung field: Secondary | ICD-10-CM | POA: Diagnosis not present

## 2019-10-07 DIAGNOSIS — Z79899 Other long term (current) drug therapy: Secondary | ICD-10-CM | POA: Diagnosis not present

## 2019-10-07 DIAGNOSIS — R5383 Other fatigue: Secondary | ICD-10-CM | POA: Diagnosis not present

## 2019-10-07 DIAGNOSIS — Z8546 Personal history of malignant neoplasm of prostate: Secondary | ICD-10-CM | POA: Diagnosis not present

## 2019-10-07 DIAGNOSIS — E7800 Pure hypercholesterolemia, unspecified: Secondary | ICD-10-CM | POA: Diagnosis not present

## 2019-10-07 DIAGNOSIS — M25511 Pain in right shoulder: Secondary | ICD-10-CM | POA: Diagnosis not present

## 2019-10-07 DIAGNOSIS — G4733 Obstructive sleep apnea (adult) (pediatric): Secondary | ICD-10-CM | POA: Diagnosis not present

## 2019-10-07 DIAGNOSIS — R5381 Other malaise: Secondary | ICD-10-CM | POA: Diagnosis not present

## 2019-10-07 DIAGNOSIS — I4892 Unspecified atrial flutter: Secondary | ICD-10-CM | POA: Diagnosis not present

## 2019-10-28 ENCOUNTER — Other Ambulatory Visit: Payer: Self-pay

## 2019-11-03 DIAGNOSIS — D510 Vitamin B12 deficiency anemia due to intrinsic factor deficiency: Secondary | ICD-10-CM | POA: Diagnosis not present

## 2019-12-04 DIAGNOSIS — D519 Vitamin B12 deficiency anemia, unspecified: Secondary | ICD-10-CM | POA: Diagnosis not present

## 2019-12-12 DIAGNOSIS — G4733 Obstructive sleep apnea (adult) (pediatric): Secondary | ICD-10-CM | POA: Diagnosis not present

## 2019-12-22 ENCOUNTER — Other Ambulatory Visit: Payer: Self-pay | Admitting: Cardiology

## 2019-12-29 DIAGNOSIS — F411 Generalized anxiety disorder: Secondary | ICD-10-CM | POA: Diagnosis not present

## 2019-12-29 DIAGNOSIS — F330 Major depressive disorder, recurrent, mild: Secondary | ICD-10-CM | POA: Diagnosis not present

## 2019-12-29 DIAGNOSIS — G4730 Sleep apnea, unspecified: Secondary | ICD-10-CM | POA: Diagnosis not present

## 2019-12-30 DIAGNOSIS — H401122 Primary open-angle glaucoma, left eye, moderate stage: Secondary | ICD-10-CM | POA: Diagnosis not present

## 2019-12-30 DIAGNOSIS — H02004 Unspecified entropion of left upper eyelid: Secondary | ICD-10-CM | POA: Diagnosis not present

## 2019-12-30 DIAGNOSIS — H2513 Age-related nuclear cataract, bilateral: Secondary | ICD-10-CM | POA: Diagnosis not present

## 2020-01-05 DIAGNOSIS — D510 Vitamin B12 deficiency anemia due to intrinsic factor deficiency: Secondary | ICD-10-CM | POA: Diagnosis not present

## 2020-01-05 DIAGNOSIS — Z23 Encounter for immunization: Secondary | ICD-10-CM | POA: Diagnosis not present

## 2020-01-13 DIAGNOSIS — B351 Tinea unguium: Secondary | ICD-10-CM | POA: Diagnosis not present

## 2020-01-13 DIAGNOSIS — L304 Erythema intertrigo: Secondary | ICD-10-CM | POA: Diagnosis not present

## 2020-01-13 DIAGNOSIS — C44319 Basal cell carcinoma of skin of other parts of face: Secondary | ICD-10-CM | POA: Diagnosis not present

## 2020-01-14 ENCOUNTER — Telehealth: Payer: Self-pay | Admitting: Cardiology

## 2020-01-14 NOTE — Telephone Encounter (Signed)
   Primary Cardiologist: Jenean Lindau, MD  Chart reviewed as part of pre-operative protocol coverage. Simple dental extractions are considered low risk procedures per guidelines and generally do not require any specific cardiac clearance. It is also generally accepted that for simple extractions and dental cleanings, there is no need to interrupt blood thinner therapy.   SBE prophylaxis is not required for the patient.  I will route this recommendation to the requesting party via Epic fax function and remove from pre-op pool.  Please call with questions.  Deberah Pelton, NP 01/14/2020, 8:52 AM

## 2020-01-14 NOTE — Telephone Encounter (Signed)
     [facility] Pre-operative Risk Assessment    HEARTCARE STAFF: - Please ensure there is not already an duplicate clearance open for this procedure. - Under Visit Info/Reason for Call, type in Other and utilize the format Clearance MM/DD/YY or Clearance TBD. Do not use dashes or single digits. - If request is for dental extraction, please clarify the # of teeth to be extracted.  Request for surgical clearance:  1. What type of surgery is being performed? Single tooth extraction  2. When is this surgery scheduled? Friday 01/16/20  3. What type of clearance is required (medical clearance vs. Pharmacy clearance to hold med vs. Both)? Both  4. Are there any medications that need to be held prior to surgery and how long?Eliquis TBD by Cardiology  5. Practice name and name of physician performing surgery? [facility]  6. What is the office phone number? [phone]   7.   What is the office fax number?  [phone]   8.   Anesthesia type (None, local, MAC, general) ? Local     [facility] needs to know when  The patient can stop the Eliquis as well as when to start it back again. The patient has a broken tooth that is cutting his tongue     Edward Johnston 01/14/2020, 8:27 AM  _________________________________________________________________   (provider comments below)

## 2020-01-15 ENCOUNTER — Other Ambulatory Visit: Payer: Self-pay | Admitting: Cardiology

## 2020-01-23 DIAGNOSIS — R590 Localized enlarged lymph nodes: Secondary | ICD-10-CM | POA: Diagnosis not present

## 2020-01-23 DIAGNOSIS — J929 Pleural plaque without asbestos: Secondary | ICD-10-CM | POA: Diagnosis not present

## 2020-01-23 DIAGNOSIS — C8590 Non-Hodgkin lymphoma, unspecified, unspecified site: Secondary | ICD-10-CM | POA: Diagnosis not present

## 2020-01-23 DIAGNOSIS — I700 Atherosclerosis of aorta: Secondary | ICD-10-CM | POA: Diagnosis not present

## 2020-01-23 DIAGNOSIS — C8305 Small cell B-cell lymphoma, lymph nodes of inguinal region and lower limb: Secondary | ICD-10-CM | POA: Diagnosis not present

## 2020-01-23 DIAGNOSIS — R918 Other nonspecific abnormal finding of lung field: Secondary | ICD-10-CM | POA: Diagnosis not present

## 2020-01-23 DIAGNOSIS — I2510 Atherosclerotic heart disease of native coronary artery without angina pectoris: Secondary | ICD-10-CM | POA: Diagnosis not present

## 2020-01-23 DIAGNOSIS — E041 Nontoxic single thyroid nodule: Secondary | ICD-10-CM | POA: Diagnosis not present

## 2020-01-27 DIAGNOSIS — C8305 Small cell B-cell lymphoma, lymph nodes of inguinal region and lower limb: Secondary | ICD-10-CM | POA: Diagnosis not present

## 2020-01-27 DIAGNOSIS — G4733 Obstructive sleep apnea (adult) (pediatric): Secondary | ICD-10-CM | POA: Diagnosis not present

## 2020-01-30 DIAGNOSIS — R1909 Other intra-abdominal and pelvic swelling, mass and lump: Secondary | ICD-10-CM | POA: Diagnosis not present

## 2020-01-30 DIAGNOSIS — C8510 Unspecified B-cell lymphoma, unspecified site: Secondary | ICD-10-CM | POA: Diagnosis not present

## 2020-01-30 DIAGNOSIS — C8305 Small cell B-cell lymphoma, lymph nodes of inguinal region and lower limb: Secondary | ICD-10-CM | POA: Diagnosis not present

## 2020-01-30 DIAGNOSIS — R590 Localized enlarged lymph nodes: Secondary | ICD-10-CM | POA: Diagnosis not present

## 2020-01-30 DIAGNOSIS — R7301 Impaired fasting glucose: Secondary | ICD-10-CM | POA: Diagnosis not present

## 2020-02-03 DIAGNOSIS — Z192 Hormone resistant malignancy status: Secondary | ICD-10-CM | POA: Diagnosis not present

## 2020-02-03 DIAGNOSIS — C8305 Small cell B-cell lymphoma, lymph nodes of inguinal region and lower limb: Secondary | ICD-10-CM | POA: Diagnosis not present

## 2020-02-03 DIAGNOSIS — C61 Malignant neoplasm of prostate: Secondary | ICD-10-CM | POA: Diagnosis not present

## 2020-02-04 ENCOUNTER — Other Ambulatory Visit: Payer: Self-pay

## 2020-02-04 DIAGNOSIS — T8859XA Other complications of anesthesia, initial encounter: Secondary | ICD-10-CM | POA: Insufficient documentation

## 2020-02-04 DIAGNOSIS — Z9889 Other specified postprocedural states: Secondary | ICD-10-CM | POA: Insufficient documentation

## 2020-02-04 DIAGNOSIS — R112 Nausea with vomiting, unspecified: Secondary | ICD-10-CM | POA: Insufficient documentation

## 2020-02-04 DIAGNOSIS — I499 Cardiac arrhythmia, unspecified: Secondary | ICD-10-CM | POA: Insufficient documentation

## 2020-02-04 DIAGNOSIS — F32A Depression, unspecified: Secondary | ICD-10-CM | POA: Insufficient documentation

## 2020-02-04 DIAGNOSIS — F419 Anxiety disorder, unspecified: Secondary | ICD-10-CM | POA: Insufficient documentation

## 2020-02-04 DIAGNOSIS — I4819 Other persistent atrial fibrillation: Secondary | ICD-10-CM | POA: Insufficient documentation

## 2020-02-04 DIAGNOSIS — R32 Unspecified urinary incontinence: Secondary | ICD-10-CM | POA: Insufficient documentation

## 2020-02-05 ENCOUNTER — Telehealth: Payer: Self-pay

## 2020-02-05 ENCOUNTER — Ambulatory Visit: Payer: PPO | Admitting: Cardiology

## 2020-02-05 ENCOUNTER — Other Ambulatory Visit: Payer: Self-pay

## 2020-02-05 ENCOUNTER — Encounter: Payer: Self-pay | Admitting: Cardiology

## 2020-02-05 VITALS — BP 112/72 | HR 121 | Ht 72.0 in | Wt 257.0 lb

## 2020-02-05 DIAGNOSIS — I4892 Unspecified atrial flutter: Secondary | ICD-10-CM | POA: Diagnosis not present

## 2020-02-05 DIAGNOSIS — Z7901 Long term (current) use of anticoagulants: Secondary | ICD-10-CM | POA: Diagnosis not present

## 2020-02-05 DIAGNOSIS — D510 Vitamin B12 deficiency anemia due to intrinsic factor deficiency: Secondary | ICD-10-CM | POA: Diagnosis not present

## 2020-02-05 DIAGNOSIS — I4821 Permanent atrial fibrillation: Secondary | ICD-10-CM

## 2020-02-05 NOTE — Progress Notes (Signed)
Cardiology Office Note:    Date:  02/05/2020   ID:  Edward Johnston, DOB [birth year], MRN [id number]  PCP:  Angelina Sheriff, MD  Cardiologist:  Jenean Lindau, MD   Referring MD: Angelina Sheriff, MD    ASSESSMENT:    1. Atrial flutter, paroxysmal ([place])   2. Permanent atrial fibrillation ([facility])   3. Current use of long term anticoagulation    PLAN:    In order of problems listed above:  1. Permanent atrial fibrillation:I discussed with the patient atrial fibrillation, disease process. Management and therapy including rate and rhythm control, anticoagulation benefits and potential risks were discussed extensively with the patient. Patient had multiple questions which were answered to patient's satisfaction. 2. The patient's heart rate is elevated.  EKG reveals atrial fibrillation with ventricular rate of 121.  I told his wife to keep a track of his pulse and blood pressure and get back to [place] in 1 week.  I will adjust medications if necessary.  Unfortunately he does not tolerate digoxin and is allergic to it. 3. Anticoagulation: Patient tolerates this well. 4. Lymphoma: Patient's wife mentions to me that he is planning chemotherapy again and the port insertion is being planned for this. 5. Patient will be seen in follow-up appointment in 6 months or earlier if the patient has any concerns 6.    Medication Adjustments/Labs and Tests Ordered: Current medicines are reviewed at length with the patient today.  Concerns regarding medicines are outlined above.  Orders Placed This Encounter  Procedures  . EKG 12-Lead   No orders of the defined types were placed in this encounter.    No chief complaint on file.    History of Present Illness:    Edward Johnston is a 69 y.o. male.  Patient has past medical history of permanent atrial fibrillation.  He denies any problems at this time and takes care of activities of daily living.  No chest pain orthopnea PND.  He has recurrence  of his lymphoma and his chemotherapy is going to be resumed.  At the time of my evaluation, the patient is alert awake oriented and in no distress.  Past Medical History:  Diagnosis Date  . Abdominal lymphadenopathy 01/31/2014  . Abnormal nuclear stress test 03/13/2019  . Acute saddle pulmonary embolism with acute cor pulmonale ([place]) 06/17/2015  . Anxiety   . Anxiety and depression 11/09/2015  . Atrial flutter, paroxysmal ([place]) 09/13/2015  . Complication of anesthesia   . Current use of long term anticoagulation 09/20/2017  . Depression   . Dysrhythmia   . ED (erectile dysfunction) of organic origin 01/31/2014  . Elevated prostate specific antigen (PSA) 01/31/2014  . Hematuria 01/31/2014  . Hilar mass 10/08/2015  . History of cerebral aneurysm repair 06/17/2015  . History of prostate cancer 06/17/2015  . History of pulmonary embolism 09/13/2015  . Hypertension    due to psych med  . Hypertrophy of prostate with urinary obstruction and other lower urinary tract symptoms (LUTS) 08/18/2013  . Incontinence   . Inguinal adenopathy 10/06/2015  . Major depressive disorder without psychotic features 02/04/2014  . Major depressive disorder, recurrent episode, moderate ([place]) 01/16/2014  . Malignant neoplasm of prostate ([place]) 08/18/2013  . Mobility impaired 07/26/2017  . Permanent atrial fibrillation ([place]) 09/20/2017  . Persistent atrial fibrillation ([place])   . PONV (postoperative nausea and vomiting)   . Postural hypotension 03/13/2019  . Severe major depression without psychotic features ([place]) 02/03/2014  . Shortness of breath  dyspnea   . Sleep apnea 07/26/2017   Overview:  no CPAP  Formatting of this note might be different from the original. Overview:  no CPAP Overview:  Overview:  no CPAP Formatting of this note might be different from the original. no CPAP  . Small B-cell lymphoma of lymph nodes of inguinal region ([place]) 10/10/2015   Overview:  10/04/2015 Molecular Pathology [id number] Monoclonl DC10+  B-cell population (~42% of lymphocytes) compatible with malignant B-cell lymphoma - with light scatter properties suggestive of small to intermediate cell size.  By itself, a CD10+ phenotype is not entirely specific for a lymphoma subtype, and flow cytometry cannot distinguish follicular from diffuse lymphomas. ... Based on our int  . SUI (stress urinary incontinence), male 12/24/2013  . Weakness generalized 07/26/2017    Past Surgical History:  Procedure Laterality Date  .  aneurysm clip    . BRAIN SURGERY  1989   aneurysm clipping- [facility]  . JOINT REPLACEMENT    . LYMPH NODE BIOPSY N/A 11/16/2015   Procedure: Biopsy of node 7,4L and 10L;  Surgeon: Grace Isaac, MD;  Location: [facility];  Service: Thoracic;  Laterality: N/A;  . PROSTATECTOMY    . REPLACEMENT TOTAL KNEE    . VIDEO BRONCHOSCOPY WITH ENDOBRONCHIAL ULTRASOUND N/A 11/16/2015   Procedure: VIDEO BRONCHOSCOPY WITH ENDOBRONCHIAL ULTRASOUND;  Surgeon: Grace Isaac, MD;  Location: [facility];  Service: Thoracic;  Laterality: N/A;    Current Medications: Current Meds  Medication Sig  . allopurinol (ZYLOPRIM) 300 MG tablet Take 300 mg by mouth daily.  Marland Kitchen apixaban (ELIQUIS) 5 MG TABS tablet Take 1 tablet (5 mg total) by mouth 2 (two) times daily.  . ARIPiprazole (ABILIFY) 2 MG tablet Take 1 mg by mouth at bedtime.   Marland Kitchen aspirin EC 81 MG tablet Take 81 mg by mouth daily.  Marland Kitchen buPROPion (WELLBUTRIN XL) 150 MG 24 hr tablet Take 150 mg by mouth daily.  . clorazepate (TRANXENE) 3.75 MG tablet Take 3.75 mg by mouth every 6 (six) hours.  Marland Kitchen diltiazem (CARDIZEM CD) 120 MG 24 hr capsule TAKE 1 CAPSULE BY MOUTH EVERY DAY  . latanoprost (XALATAN) 0.005 % ophthalmic solution Place 1 drop into the left eye at bedtime.  . metoprolol succinate (TOPROL-XL) 100 MG 24 hr tablet TAKE 1 TABLET (100 MG TOTAL) BY MOUTH DAILY. TAKE WITH OR IMMEDIATELY FOLLOWING A MEAL.  Marland Kitchen omeprazole (PRILOSEC) 20 MG capsule Take 20 mg by mouth daily.  . Vilazodone HCl (VIIBRYD) 20  MG TABS Take 20 mg by mouth daily.      Allergies:   Digoxin   Social History   Socioeconomic History  . Marital status: Married    Spouse name: Not on file  . Number of children: Not on file  . Years of education: Not on file  . Highest education level: Not on file  Occupational History  . Not on file  Tobacco Use  . Smoking status: Never Smoker  . Smokeless tobacco: Never Used  Vaping Use  . Vaping Use: Never used  Substance and Sexual Activity  . Alcohol use: No  . Drug use: No  . Sexual activity: Not Currently  Other Topics Concern  . Not on file  Social History Narrative  . Not on file   Social Determinants of Health   Financial Resource Strain:   . Difficulty of Paying Living Expenses: Not on file  Food Insecurity:   . Worried About Charity fundraiser in the Last Year: Not on  file  . [place] in the Last Year: Not on file  Transportation Needs:   . Lack of Transportation (Medical): Not on file  . Lack of Transportation (Non-Medical): Not on file  Physical Activity:   . Days of Exercise per Week: Not on file  . Minutes of Exercise per Session: Not on file  Stress:   . Feeling of Stress : Not on file  Social Connections:   . Frequency of Communication with Friends and Family: Not on file  . Frequency of Social Gatherings with Friends and Family: Not on file  . Attends Religious Services: Not on file  . Active Member of Clubs or Organizations: Not on file  . Attends Archivist Meetings: Not on file  . Marital Status: Not on file     Family History: The patient's family history includes Breast cancer in his mother; Depression in his sister; Diabetes in his sister; Heart attack in his father; Heart disease in his sister.  ROS:   Please see the history of present illness.    All other systems reviewed and are negative.  EKGs/Labs/Other Studies Reviewed:    The following studies were reviewed today:  IMPRESSIONS:  1. Left ventricular  ejection fraction, by visual estimation, is 45 to  50%. The left ventricle has normal function. There is mildly increased  left ventricular hypertrophy.  2. Left ventricular diastolic parameters are indeterminate.  3. Left atrial size was normal.  4. The mitral valve is normal in structure. No evidence of mitral valve  regurgitation. No evidence of mitral stenosis.  5. The tricuspid valve is normal in structure. Tricuspid valve  regurgitation is not demonstrated.  6. Aortic valve regurgitation is trivial. No evidence of aortic valve  sclerosis or stenosis.    Recent Labs: 03/18/2019: BUN 13; Creatinine, Ser 1.15; Potassium 4.0; Sodium 142; TSH 3.880  Recent Lipid Panel    Component Value Date/Time   CHOL 170 03/18/2019 1003   TRIG 149 03/18/2019 1003   HDL 33 (L) 03/18/2019 1003   CHOLHDL 5.2 (H) 03/18/2019 1003   LDLCALC 110 (H) 03/18/2019 1003    Physical Exam:    VS:  BP 112/72   Pulse (!) 121   Ht 6' (1.829 m)   Wt 257 lb (116.6 kg)   SpO2 97%   BMI 34.86 kg/m     Wt Readings from Last 3 Encounters:  02/05/20 257 lb (116.6 kg)  08/05/19 259 lb (117.5 kg)  04/14/19 268 lb 3.2 oz (121.7 kg)     GEN: Patient is in no acute distress HEENT: Normal NECK: No JVD; No carotid bruits LYMPHATICS: No lymphadenopathy CARDIAC: Hear sounds regular, 2/6 systolic murmur at the apex. RESPIRATORY:  Clear to auscultation without rales, wheezing or rhonchi  ABDOMEN: Soft, non-tender, non-distended MUSCULOSKELETAL:  No edema; No deformity  SKIN: Warm and dry NEUROLOGIC:  Alert and oriented x 3 PSYCHIATRIC:  Normal affect   Signed, Jenean Lindau, MD  02/05/2020 2:02 PM    [place]

## 2020-02-05 NOTE — Patient Instructions (Signed)

## 2020-02-05 NOTE — Telephone Encounter (Signed)
4 boxes of Eliquis given per Dr. Geraldo Pitter. Lot # [id number] Exp. 02/2022

## 2020-02-11 DIAGNOSIS — Z452 Encounter for adjustment and management of vascular access device: Secondary | ICD-10-CM | POA: Diagnosis not present

## 2020-02-11 DIAGNOSIS — Z95828 Presence of other vascular implants and grafts: Secondary | ICD-10-CM | POA: Diagnosis not present

## 2020-02-11 DIAGNOSIS — C8202 Follicular lymphoma grade I, intrathoracic lymph nodes: Secondary | ICD-10-CM | POA: Diagnosis not present

## 2020-02-11 DIAGNOSIS — C8294 Follicular lymphoma, unspecified, lymph nodes of axilla and upper limb: Secondary | ICD-10-CM | POA: Diagnosis not present

## 2020-02-11 DIAGNOSIS — C8305 Small cell B-cell lymphoma, lymph nodes of inguinal region and lower limb: Secondary | ICD-10-CM | POA: Diagnosis not present

## 2020-02-11 DIAGNOSIS — R590 Localized enlarged lymph nodes: Secondary | ICD-10-CM | POA: Diagnosis not present

## 2020-02-13 DIAGNOSIS — C8305 Small cell B-cell lymphoma, lymph nodes of inguinal region and lower limb: Secondary | ICD-10-CM | POA: Diagnosis not present

## 2020-02-17 DIAGNOSIS — Z803 Family history of malignant neoplasm of breast: Secondary | ICD-10-CM | POA: Diagnosis not present

## 2020-02-17 DIAGNOSIS — C8305 Small cell B-cell lymphoma, lymph nodes of inguinal region and lower limb: Secondary | ICD-10-CM | POA: Diagnosis not present

## 2020-02-17 DIAGNOSIS — C61 Malignant neoplasm of prostate: Secondary | ICD-10-CM | POA: Diagnosis not present

## 2020-02-17 DIAGNOSIS — Z79899 Other long term (current) drug therapy: Secondary | ICD-10-CM | POA: Diagnosis not present

## 2020-02-18 DIAGNOSIS — Z5111 Encounter for antineoplastic chemotherapy: Secondary | ICD-10-CM | POA: Diagnosis not present

## 2020-02-18 DIAGNOSIS — C8305 Small cell B-cell lymphoma, lymph nodes of inguinal region and lower limb: Secondary | ICD-10-CM | POA: Diagnosis not present

## 2020-02-19 DIAGNOSIS — Z79899 Other long term (current) drug therapy: Secondary | ICD-10-CM | POA: Diagnosis not present

## 2020-02-19 DIAGNOSIS — C8305 Small cell B-cell lymphoma, lymph nodes of inguinal region and lower limb: Secondary | ICD-10-CM | POA: Diagnosis not present

## 2020-02-19 DIAGNOSIS — Z7689 Persons encountering health services in other specified circumstances: Secondary | ICD-10-CM | POA: Diagnosis not present

## 2020-02-24 DIAGNOSIS — C61 Malignant neoplasm of prostate: Secondary | ICD-10-CM | POA: Diagnosis not present

## 2020-03-02 DIAGNOSIS — C8305 Small cell B-cell lymphoma, lymph nodes of inguinal region and lower limb: Secondary | ICD-10-CM | POA: Diagnosis not present

## 2020-03-02 DIAGNOSIS — C8308 Small cell B-cell lymphoma, lymph nodes of multiple sites: Secondary | ICD-10-CM | POA: Diagnosis not present

## 2020-03-02 DIAGNOSIS — R911 Solitary pulmonary nodule: Secondary | ICD-10-CM | POA: Diagnosis not present

## 2020-03-04 DIAGNOSIS — D519 Vitamin B12 deficiency anemia, unspecified: Secondary | ICD-10-CM | POA: Diagnosis not present

## 2020-03-08 ENCOUNTER — Other Ambulatory Visit: Payer: Self-pay | Admitting: Cardiology

## 2020-03-08 DIAGNOSIS — I951 Orthostatic hypotension: Secondary | ICD-10-CM

## 2020-03-08 DIAGNOSIS — I10 Essential (primary) hypertension: Secondary | ICD-10-CM

## 2020-03-16 DIAGNOSIS — E041 Nontoxic single thyroid nodule: Secondary | ICD-10-CM | POA: Diagnosis not present

## 2020-03-16 DIAGNOSIS — Z79899 Other long term (current) drug therapy: Secondary | ICD-10-CM | POA: Diagnosis not present

## 2020-03-16 DIAGNOSIS — Z803 Family history of malignant neoplasm of breast: Secondary | ICD-10-CM | POA: Diagnosis not present

## 2020-03-16 DIAGNOSIS — C61 Malignant neoplasm of prostate: Secondary | ICD-10-CM | POA: Diagnosis not present

## 2020-03-16 DIAGNOSIS — C8305 Small cell B-cell lymphoma, lymph nodes of inguinal region and lower limb: Secondary | ICD-10-CM | POA: Diagnosis not present

## 2020-03-16 DIAGNOSIS — R5383 Other fatigue: Secondary | ICD-10-CM | POA: Diagnosis not present

## 2020-03-16 DIAGNOSIS — J90 Pleural effusion, not elsewhere classified: Secondary | ICD-10-CM | POA: Diagnosis not present

## 2020-03-16 DIAGNOSIS — Z7982 Long term (current) use of aspirin: Secondary | ICD-10-CM | POA: Diagnosis not present

## 2020-03-16 DIAGNOSIS — Z86711 Personal history of pulmonary embolism: Secondary | ICD-10-CM | POA: Diagnosis not present

## 2020-03-16 DIAGNOSIS — I700 Atherosclerosis of aorta: Secondary | ICD-10-CM | POA: Diagnosis not present

## 2020-03-16 DIAGNOSIS — R918 Other nonspecific abnormal finding of lung field: Secondary | ICD-10-CM | POA: Diagnosis not present

## 2020-03-16 DIAGNOSIS — R590 Localized enlarged lymph nodes: Secondary | ICD-10-CM | POA: Diagnosis not present

## 2020-03-16 DIAGNOSIS — K760 Fatty (change of) liver, not elsewhere classified: Secondary | ICD-10-CM | POA: Diagnosis not present

## 2020-03-16 DIAGNOSIS — Z7901 Long term (current) use of anticoagulants: Secondary | ICD-10-CM | POA: Diagnosis not present

## 2020-03-17 DIAGNOSIS — C8305 Small cell B-cell lymphoma, lymph nodes of inguinal region and lower limb: Secondary | ICD-10-CM | POA: Diagnosis not present

## 2020-03-17 DIAGNOSIS — Z79899 Other long term (current) drug therapy: Secondary | ICD-10-CM | POA: Diagnosis not present

## 2020-03-17 DIAGNOSIS — Z5111 Encounter for antineoplastic chemotherapy: Secondary | ICD-10-CM | POA: Diagnosis not present

## 2020-03-17 DIAGNOSIS — G4733 Obstructive sleep apnea (adult) (pediatric): Secondary | ICD-10-CM | POA: Diagnosis not present

## 2020-03-18 DIAGNOSIS — C8305 Small cell B-cell lymphoma, lymph nodes of inguinal region and lower limb: Secondary | ICD-10-CM | POA: Diagnosis not present

## 2020-03-18 DIAGNOSIS — Z79899 Other long term (current) drug therapy: Secondary | ICD-10-CM | POA: Diagnosis not present

## 2020-03-18 DIAGNOSIS — Z7689 Persons encountering health services in other specified circumstances: Secondary | ICD-10-CM | POA: Diagnosis not present

## 2020-03-23 DIAGNOSIS — Z23 Encounter for immunization: Secondary | ICD-10-CM | POA: Diagnosis not present

## 2020-03-25 DIAGNOSIS — C8305 Small cell B-cell lymphoma, lymph nodes of inguinal region and lower limb: Secondary | ICD-10-CM | POA: Diagnosis not present

## 2020-04-06 DIAGNOSIS — D510 Vitamin B12 deficiency anemia due to intrinsic factor deficiency: Secondary | ICD-10-CM | POA: Diagnosis not present

## 2020-04-13 DIAGNOSIS — R918 Other nonspecific abnormal finding of lung field: Secondary | ICD-10-CM | POA: Diagnosis not present

## 2020-04-13 DIAGNOSIS — Z79899 Other long term (current) drug therapy: Secondary | ICD-10-CM | POA: Diagnosis not present

## 2020-04-13 DIAGNOSIS — C8305 Small cell B-cell lymphoma, lymph nodes of inguinal region and lower limb: Secondary | ICD-10-CM | POA: Diagnosis not present

## 2020-04-14 DIAGNOSIS — C8305 Small cell B-cell lymphoma, lymph nodes of inguinal region and lower limb: Secondary | ICD-10-CM | POA: Diagnosis not present

## 2020-04-15 DIAGNOSIS — Z7689 Persons encountering health services in other specified circumstances: Secondary | ICD-10-CM | POA: Diagnosis not present

## 2020-04-15 DIAGNOSIS — C8305 Small cell B-cell lymphoma, lymph nodes of inguinal region and lower limb: Secondary | ICD-10-CM | POA: Diagnosis not present

## 2020-04-16 DIAGNOSIS — G4733 Obstructive sleep apnea (adult) (pediatric): Secondary | ICD-10-CM | POA: Diagnosis not present

## 2020-04-27 DIAGNOSIS — C8305 Small cell B-cell lymphoma, lymph nodes of inguinal region and lower limb: Secondary | ICD-10-CM | POA: Diagnosis not present

## 2020-04-27 DIAGNOSIS — C8294 Follicular lymphoma, unspecified, lymph nodes of axilla and upper limb: Secondary | ICD-10-CM | POA: Diagnosis not present

## 2020-05-03 ENCOUNTER — Telehealth: Payer: Self-pay | Admitting: Cardiology

## 2020-05-03 NOTE — Telephone Encounter (Signed)
Patient calling the office for samples of medication:   1.  What medication and dosage are you requesting samples for? apixaban (ELIQUIS) 5 MG TABS tablet  2.  Are you currently out of this medication? No, has enough for this week

## 2020-05-03 NOTE — Telephone Encounter (Signed)
Spoke to patient's wife per patient on the phone informing them that we are out of Eliquis samples.  I asked if they had filled out the patient assistance application for it and they have not.  I encouraged her to get it filled out so we can get it sent in.  I explained a lot of our patient's were having trouble getting their Eliquis hence why we have no samples so it was very important to get those filled out and brought to [place] as soon as possible.  She states she will work on them and bring them by when she can.

## 2020-05-04 DIAGNOSIS — D510 Vitamin B12 deficiency anemia due to intrinsic factor deficiency: Secondary | ICD-10-CM | POA: Diagnosis not present

## 2020-05-06 DIAGNOSIS — I2510 Atherosclerotic heart disease of native coronary artery without angina pectoris: Secondary | ICD-10-CM | POA: Diagnosis not present

## 2020-05-06 DIAGNOSIS — C8305 Small cell B-cell lymphoma, lymph nodes of inguinal region and lower limb: Secondary | ICD-10-CM | POA: Diagnosis not present

## 2020-05-06 DIAGNOSIS — I700 Atherosclerosis of aorta: Secondary | ICD-10-CM | POA: Diagnosis not present

## 2020-05-06 DIAGNOSIS — I517 Cardiomegaly: Secondary | ICD-10-CM | POA: Diagnosis not present

## 2020-05-06 DIAGNOSIS — C8590 Non-Hodgkin lymphoma, unspecified, unspecified site: Secondary | ICD-10-CM | POA: Diagnosis not present

## 2020-05-11 DIAGNOSIS — Z79899 Other long term (current) drug therapy: Secondary | ICD-10-CM | POA: Diagnosis not present

## 2020-05-11 DIAGNOSIS — C8305 Small cell B-cell lymphoma, lymph nodes of inguinal region and lower limb: Secondary | ICD-10-CM | POA: Diagnosis not present

## 2020-05-11 DIAGNOSIS — R918 Other nonspecific abnormal finding of lung field: Secondary | ICD-10-CM | POA: Diagnosis not present

## 2020-05-11 DIAGNOSIS — Z5111 Encounter for antineoplastic chemotherapy: Secondary | ICD-10-CM | POA: Diagnosis not present

## 2020-05-12 DIAGNOSIS — C8305 Small cell B-cell lymphoma, lymph nodes of inguinal region and lower limb: Secondary | ICD-10-CM | POA: Diagnosis not present

## 2020-05-13 DIAGNOSIS — C8305 Small cell B-cell lymphoma, lymph nodes of inguinal region and lower limb: Secondary | ICD-10-CM | POA: Diagnosis not present

## 2020-05-13 DIAGNOSIS — Z7689 Persons encountering health services in other specified circumstances: Secondary | ICD-10-CM | POA: Diagnosis not present

## 2020-05-20 DIAGNOSIS — G4733 Obstructive sleep apnea (adult) (pediatric): Secondary | ICD-10-CM | POA: Diagnosis not present

## 2020-05-28 DIAGNOSIS — R0600 Dyspnea, unspecified: Secondary | ICD-10-CM | POA: Diagnosis not present

## 2020-05-28 DIAGNOSIS — R0982 Postnasal drip: Secondary | ICD-10-CM | POA: Diagnosis not present

## 2020-05-28 DIAGNOSIS — R918 Other nonspecific abnormal finding of lung field: Secondary | ICD-10-CM | POA: Diagnosis not present

## 2020-05-28 DIAGNOSIS — G4733 Obstructive sleep apnea (adult) (pediatric): Secondary | ICD-10-CM | POA: Diagnosis not present

## 2020-05-31 DIAGNOSIS — D510 Vitamin B12 deficiency anemia due to intrinsic factor deficiency: Secondary | ICD-10-CM | POA: Diagnosis not present

## 2020-06-09 DIAGNOSIS — C8305 Small cell B-cell lymphoma, lymph nodes of inguinal region and lower limb: Secondary | ICD-10-CM | POA: Diagnosis not present

## 2020-06-09 DIAGNOSIS — Z723 Lack of physical exercise: Secondary | ICD-10-CM | POA: Diagnosis not present

## 2020-06-09 DIAGNOSIS — Z79899 Other long term (current) drug therapy: Secondary | ICD-10-CM | POA: Diagnosis not present

## 2020-06-10 DIAGNOSIS — Z5111 Encounter for antineoplastic chemotherapy: Secondary | ICD-10-CM | POA: Diagnosis not present

## 2020-06-10 DIAGNOSIS — C8305 Small cell B-cell lymphoma, lymph nodes of inguinal region and lower limb: Secondary | ICD-10-CM | POA: Diagnosis not present

## 2020-06-10 DIAGNOSIS — Z79899 Other long term (current) drug therapy: Secondary | ICD-10-CM | POA: Diagnosis not present

## 2020-06-11 DIAGNOSIS — C8305 Small cell B-cell lymphoma, lymph nodes of inguinal region and lower limb: Secondary | ICD-10-CM | POA: Diagnosis not present

## 2020-06-11 DIAGNOSIS — Z7689 Persons encountering health services in other specified circumstances: Secondary | ICD-10-CM | POA: Diagnosis not present

## 2020-06-21 ENCOUNTER — Other Ambulatory Visit: Payer: Self-pay | Admitting: Cardiology

## 2020-06-21 DIAGNOSIS — C8305 Small cell B-cell lymphoma, lymph nodes of inguinal region and lower limb: Secondary | ICD-10-CM | POA: Diagnosis not present

## 2020-06-21 DIAGNOSIS — G4733 Obstructive sleep apnea (adult) (pediatric): Secondary | ICD-10-CM | POA: Diagnosis not present

## 2020-06-21 DIAGNOSIS — C8294 Follicular lymphoma, unspecified, lymph nodes of axilla and upper limb: Secondary | ICD-10-CM | POA: Diagnosis not present

## 2020-06-24 DIAGNOSIS — F330 Major depressive disorder, recurrent, mild: Secondary | ICD-10-CM | POA: Diagnosis not present

## 2020-06-24 DIAGNOSIS — G4730 Sleep apnea, unspecified: Secondary | ICD-10-CM | POA: Diagnosis not present

## 2020-06-24 DIAGNOSIS — F411 Generalized anxiety disorder: Secondary | ICD-10-CM | POA: Diagnosis not present

## 2020-07-06 DIAGNOSIS — H2513 Age-related nuclear cataract, bilateral: Secondary | ICD-10-CM | POA: Diagnosis not present

## 2020-07-06 DIAGNOSIS — H02004 Unspecified entropion of left upper eyelid: Secondary | ICD-10-CM | POA: Diagnosis not present

## 2020-07-06 DIAGNOSIS — D510 Vitamin B12 deficiency anemia due to intrinsic factor deficiency: Secondary | ICD-10-CM | POA: Diagnosis not present

## 2020-07-06 DIAGNOSIS — H401122 Primary open-angle glaucoma, left eye, moderate stage: Secondary | ICD-10-CM | POA: Diagnosis not present

## 2020-07-07 DIAGNOSIS — Z79899 Other long term (current) drug therapy: Secondary | ICD-10-CM | POA: Diagnosis not present

## 2020-07-07 DIAGNOSIS — Z5111 Encounter for antineoplastic chemotherapy: Secondary | ICD-10-CM | POA: Diagnosis not present

## 2020-07-07 DIAGNOSIS — R918 Other nonspecific abnormal finding of lung field: Secondary | ICD-10-CM | POA: Diagnosis not present

## 2020-07-07 DIAGNOSIS — C8305 Small cell B-cell lymphoma, lymph nodes of inguinal region and lower limb: Secondary | ICD-10-CM | POA: Diagnosis not present

## 2020-07-07 DIAGNOSIS — Z5112 Encounter for antineoplastic immunotherapy: Secondary | ICD-10-CM | POA: Diagnosis not present

## 2020-07-07 DIAGNOSIS — Z723 Lack of physical exercise: Secondary | ICD-10-CM | POA: Diagnosis not present

## 2020-07-08 DIAGNOSIS — C8305 Small cell B-cell lymphoma, lymph nodes of inguinal region and lower limb: Secondary | ICD-10-CM | POA: Diagnosis not present

## 2020-07-08 DIAGNOSIS — Z5111 Encounter for antineoplastic chemotherapy: Secondary | ICD-10-CM | POA: Diagnosis not present

## 2020-07-08 DIAGNOSIS — Z79899 Other long term (current) drug therapy: Secondary | ICD-10-CM | POA: Diagnosis not present

## 2020-07-09 DIAGNOSIS — Z7689 Persons encountering health services in other specified circumstances: Secondary | ICD-10-CM | POA: Diagnosis not present

## 2020-07-09 DIAGNOSIS — C8305 Small cell B-cell lymphoma, lymph nodes of inguinal region and lower limb: Secondary | ICD-10-CM | POA: Diagnosis not present

## 2020-07-22 DIAGNOSIS — G4733 Obstructive sleep apnea (adult) (pediatric): Secondary | ICD-10-CM | POA: Diagnosis not present

## 2020-07-25 DIAGNOSIS — Z751 Person awaiting admission to adequate facility elsewhere: Secondary | ICD-10-CM | POA: Diagnosis not present

## 2020-07-25 DIAGNOSIS — C8590 Non-Hodgkin lymphoma, unspecified, unspecified site: Secondary | ICD-10-CM | POA: Diagnosis not present

## 2020-07-25 DIAGNOSIS — J9690 Respiratory failure, unspecified, unspecified whether with hypoxia or hypercapnia: Secondary | ICD-10-CM | POA: Diagnosis not present

## 2020-07-25 DIAGNOSIS — I499 Cardiac arrhythmia, unspecified: Secondary | ICD-10-CM | POA: Diagnosis not present

## 2020-07-25 DIAGNOSIS — E876 Hypokalemia: Secondary | ICD-10-CM | POA: Diagnosis not present

## 2020-07-25 DIAGNOSIS — R0902 Hypoxemia: Secondary | ICD-10-CM | POA: Diagnosis not present

## 2020-07-25 DIAGNOSIS — Z79899 Other long term (current) drug therapy: Secondary | ICD-10-CM | POA: Diagnosis not present

## 2020-07-25 DIAGNOSIS — A419 Sepsis, unspecified organism: Secondary | ICD-10-CM | POA: Diagnosis not present

## 2020-07-25 DIAGNOSIS — I4891 Unspecified atrial fibrillation: Secondary | ICD-10-CM | POA: Diagnosis not present

## 2020-07-25 DIAGNOSIS — U071 COVID-19: Secondary | ICD-10-CM | POA: Diagnosis not present

## 2020-07-25 DIAGNOSIS — Z86711 Personal history of pulmonary embolism: Secondary | ICD-10-CM | POA: Diagnosis not present

## 2020-07-25 DIAGNOSIS — F418 Other specified anxiety disorders: Secondary | ICD-10-CM | POA: Diagnosis not present

## 2020-07-25 DIAGNOSIS — M818 Other osteoporosis without current pathological fracture: Secondary | ICD-10-CM | POA: Diagnosis not present

## 2020-07-25 DIAGNOSIS — R000 Tachycardia, unspecified: Secondary | ICD-10-CM | POA: Diagnosis not present

## 2020-07-25 DIAGNOSIS — R279 Unspecified lack of coordination: Secondary | ICD-10-CM | POA: Diagnosis not present

## 2020-07-25 DIAGNOSIS — F32A Depression, unspecified: Secondary | ICD-10-CM | POA: Diagnosis not present

## 2020-07-25 DIAGNOSIS — J9601 Acute respiratory failure with hypoxia: Secondary | ICD-10-CM | POA: Diagnosis not present

## 2020-07-25 DIAGNOSIS — R197 Diarrhea, unspecified: Secondary | ICD-10-CM | POA: Diagnosis not present

## 2020-07-25 DIAGNOSIS — M6281 Muscle weakness (generalized): Secondary | ICD-10-CM | POA: Diagnosis not present

## 2020-07-25 DIAGNOSIS — E46 Unspecified protein-calorie malnutrition: Secondary | ICD-10-CM | POA: Diagnosis not present

## 2020-07-25 DIAGNOSIS — I480 Paroxysmal atrial fibrillation: Secondary | ICD-10-CM | POA: Diagnosis not present

## 2020-07-25 DIAGNOSIS — Z7902 Long term (current) use of antithrombotics/antiplatelets: Secondary | ICD-10-CM | POA: Diagnosis not present

## 2020-07-25 DIAGNOSIS — R0689 Other abnormalities of breathing: Secondary | ICD-10-CM | POA: Diagnosis not present

## 2020-07-25 DIAGNOSIS — R2681 Unsteadiness on feet: Secondary | ICD-10-CM | POA: Diagnosis not present

## 2020-07-25 DIAGNOSIS — J189 Pneumonia, unspecified organism: Secondary | ICD-10-CM | POA: Diagnosis not present

## 2020-07-25 DIAGNOSIS — I517 Cardiomegaly: Secondary | ICD-10-CM | POA: Diagnosis not present

## 2020-07-25 DIAGNOSIS — K219 Gastro-esophageal reflux disease without esophagitis: Secondary | ICD-10-CM | POA: Diagnosis not present

## 2020-07-25 DIAGNOSIS — R531 Weakness: Secondary | ICD-10-CM | POA: Diagnosis not present

## 2020-07-25 DIAGNOSIS — R278 Other lack of coordination: Secondary | ICD-10-CM | POA: Diagnosis not present

## 2020-07-25 DIAGNOSIS — Z743 Need for continuous supervision: Secondary | ICD-10-CM | POA: Diagnosis not present

## 2020-07-25 DIAGNOSIS — J181 Lobar pneumonia, unspecified organism: Secondary | ICD-10-CM | POA: Diagnosis not present

## 2020-07-25 DIAGNOSIS — M1990 Unspecified osteoarthritis, unspecified site: Secondary | ICD-10-CM | POA: Diagnosis not present

## 2020-07-25 DIAGNOSIS — D649 Anemia, unspecified: Secondary | ICD-10-CM | POA: Diagnosis not present

## 2020-07-25 DIAGNOSIS — R9431 Abnormal electrocardiogram [ECG] [EKG]: Secondary | ICD-10-CM | POA: Diagnosis not present

## 2020-07-25 DIAGNOSIS — I10 Essential (primary) hypertension: Secondary | ICD-10-CM | POA: Diagnosis not present

## 2020-07-25 DIAGNOSIS — R41841 Cognitive communication deficit: Secondary | ICD-10-CM | POA: Diagnosis not present

## 2020-07-25 DIAGNOSIS — D849 Immunodeficiency, unspecified: Secondary | ICD-10-CM | POA: Diagnosis not present

## 2020-08-12 DIAGNOSIS — J189 Pneumonia, unspecified organism: Secondary | ICD-10-CM | POA: Diagnosis not present

## 2020-08-12 DIAGNOSIS — A419 Sepsis, unspecified organism: Secondary | ICD-10-CM | POA: Diagnosis not present

## 2020-08-12 DIAGNOSIS — F418 Other specified anxiety disorders: Secondary | ICD-10-CM | POA: Diagnosis not present

## 2020-08-12 DIAGNOSIS — I4891 Unspecified atrial fibrillation: Secondary | ICD-10-CM | POA: Diagnosis not present

## 2020-08-12 DIAGNOSIS — K219 Gastro-esophageal reflux disease without esophagitis: Secondary | ICD-10-CM | POA: Diagnosis not present

## 2020-08-12 DIAGNOSIS — Z743 Need for continuous supervision: Secondary | ICD-10-CM | POA: Diagnosis not present

## 2020-08-12 DIAGNOSIS — C8590 Non-Hodgkin lymphoma, unspecified, unspecified site: Secondary | ICD-10-CM | POA: Diagnosis not present

## 2020-08-12 DIAGNOSIS — I10 Essential (primary) hypertension: Secondary | ICD-10-CM | POA: Diagnosis not present

## 2020-08-12 DIAGNOSIS — R41841 Cognitive communication deficit: Secondary | ICD-10-CM | POA: Diagnosis not present

## 2020-08-12 DIAGNOSIS — E441 Mild protein-calorie malnutrition: Secondary | ICD-10-CM | POA: Diagnosis not present

## 2020-08-12 DIAGNOSIS — R279 Unspecified lack of coordination: Secondary | ICD-10-CM | POA: Diagnosis not present

## 2020-08-12 DIAGNOSIS — R531 Weakness: Secondary | ICD-10-CM | POA: Diagnosis not present

## 2020-08-12 DIAGNOSIS — I480 Paroxysmal atrial fibrillation: Secondary | ICD-10-CM | POA: Diagnosis not present

## 2020-08-12 DIAGNOSIS — R2681 Unsteadiness on feet: Secondary | ICD-10-CM | POA: Diagnosis not present

## 2020-08-12 DIAGNOSIS — E46 Unspecified protein-calorie malnutrition: Secondary | ICD-10-CM | POA: Diagnosis not present

## 2020-08-12 DIAGNOSIS — U071 COVID-19: Secondary | ICD-10-CM | POA: Diagnosis not present

## 2020-08-12 DIAGNOSIS — G4733 Obstructive sleep apnea (adult) (pediatric): Secondary | ICD-10-CM | POA: Diagnosis not present

## 2020-08-12 DIAGNOSIS — J9621 Acute and chronic respiratory failure with hypoxia: Secondary | ICD-10-CM | POA: Diagnosis not present

## 2020-08-12 DIAGNOSIS — M818 Other osteoporosis without current pathological fracture: Secondary | ICD-10-CM | POA: Diagnosis not present

## 2020-08-12 DIAGNOSIS — E876 Hypokalemia: Secondary | ICD-10-CM | POA: Diagnosis not present

## 2020-08-12 DIAGNOSIS — R278 Other lack of coordination: Secondary | ICD-10-CM | POA: Diagnosis not present

## 2020-08-12 DIAGNOSIS — J9690 Respiratory failure, unspecified, unspecified whether with hypoxia or hypercapnia: Secondary | ICD-10-CM | POA: Diagnosis not present

## 2020-08-12 DIAGNOSIS — R262 Difficulty in walking, not elsewhere classified: Secondary | ICD-10-CM | POA: Diagnosis not present

## 2020-08-12 DIAGNOSIS — M6281 Muscle weakness (generalized): Secondary | ICD-10-CM | POA: Diagnosis not present

## 2020-08-13 DIAGNOSIS — J9621 Acute and chronic respiratory failure with hypoxia: Secondary | ICD-10-CM | POA: Diagnosis not present

## 2020-08-13 DIAGNOSIS — I4891 Unspecified atrial fibrillation: Secondary | ICD-10-CM | POA: Diagnosis not present

## 2020-08-13 DIAGNOSIS — E441 Mild protein-calorie malnutrition: Secondary | ICD-10-CM | POA: Diagnosis not present

## 2020-08-13 DIAGNOSIS — R262 Difficulty in walking, not elsewhere classified: Secondary | ICD-10-CM | POA: Diagnosis not present

## 2020-08-23 DIAGNOSIS — G4733 Obstructive sleep apnea (adult) (pediatric): Secondary | ICD-10-CM | POA: Diagnosis not present

## 2020-08-31 DIAGNOSIS — D510 Vitamin B12 deficiency anemia due to intrinsic factor deficiency: Secondary | ICD-10-CM | POA: Diagnosis not present

## 2020-08-31 DIAGNOSIS — Z8616 Personal history of COVID-19: Secondary | ICD-10-CM | POA: Diagnosis not present

## 2020-08-31 DIAGNOSIS — J984 Other disorders of lung: Secondary | ICD-10-CM | POA: Diagnosis not present

## 2020-08-31 DIAGNOSIS — Z6832 Body mass index (BMI) 32.0-32.9, adult: Secondary | ICD-10-CM | POA: Diagnosis not present

## 2020-08-31 DIAGNOSIS — Z8579 Personal history of other malignant neoplasms of lymphoid, hematopoietic and related tissues: Secondary | ICD-10-CM | POA: Diagnosis not present

## 2020-08-31 DIAGNOSIS — I4892 Unspecified atrial flutter: Secondary | ICD-10-CM | POA: Diagnosis not present

## 2020-09-01 DIAGNOSIS — Z7901 Long term (current) use of anticoagulants: Secondary | ICD-10-CM | POA: Diagnosis not present

## 2020-09-01 DIAGNOSIS — Z8701 Personal history of pneumonia (recurrent): Secondary | ICD-10-CM | POA: Diagnosis not present

## 2020-09-01 DIAGNOSIS — H9190 Unspecified hearing loss, unspecified ear: Secondary | ICD-10-CM | POA: Diagnosis not present

## 2020-09-01 DIAGNOSIS — M810 Age-related osteoporosis without current pathological fracture: Secondary | ICD-10-CM | POA: Diagnosis not present

## 2020-09-01 DIAGNOSIS — Z8572 Personal history of non-Hodgkin lymphomas: Secondary | ICD-10-CM | POA: Diagnosis not present

## 2020-09-01 DIAGNOSIS — Z86711 Personal history of pulmonary embolism: Secondary | ICD-10-CM | POA: Diagnosis not present

## 2020-09-01 DIAGNOSIS — E46 Unspecified protein-calorie malnutrition: Secondary | ICD-10-CM | POA: Diagnosis not present

## 2020-09-01 DIAGNOSIS — M2520 Flail joint, unspecified joint: Secondary | ICD-10-CM | POA: Diagnosis not present

## 2020-09-01 DIAGNOSIS — U071 COVID-19: Secondary | ICD-10-CM | POA: Diagnosis not present

## 2020-09-01 DIAGNOSIS — J9621 Acute and chronic respiratory failure with hypoxia: Secondary | ICD-10-CM | POA: Diagnosis not present

## 2020-09-01 DIAGNOSIS — F419 Anxiety disorder, unspecified: Secondary | ICD-10-CM | POA: Diagnosis not present

## 2020-09-01 DIAGNOSIS — I4891 Unspecified atrial fibrillation: Secondary | ICD-10-CM | POA: Diagnosis not present

## 2020-09-01 DIAGNOSIS — D649 Anemia, unspecified: Secondary | ICD-10-CM | POA: Diagnosis not present

## 2020-09-01 DIAGNOSIS — K5900 Constipation, unspecified: Secondary | ICD-10-CM | POA: Diagnosis not present

## 2020-09-01 DIAGNOSIS — K219 Gastro-esophageal reflux disease without esophagitis: Secondary | ICD-10-CM | POA: Diagnosis not present

## 2020-09-01 DIAGNOSIS — F32A Depression, unspecified: Secondary | ICD-10-CM | POA: Diagnosis not present

## 2020-09-03 DIAGNOSIS — J9690 Respiratory failure, unspecified, unspecified whether with hypoxia or hypercapnia: Secondary | ICD-10-CM | POA: Diagnosis not present

## 2020-09-03 DIAGNOSIS — R2681 Unsteadiness on feet: Secondary | ICD-10-CM | POA: Diagnosis not present

## 2020-09-03 DIAGNOSIS — M6281 Muscle weakness (generalized): Secondary | ICD-10-CM | POA: Diagnosis not present

## 2020-09-07 DIAGNOSIS — Z1331 Encounter for screening for depression: Secondary | ICD-10-CM | POA: Diagnosis not present

## 2020-09-07 DIAGNOSIS — Z6835 Body mass index (BMI) 35.0-35.9, adult: Secondary | ICD-10-CM | POA: Diagnosis not present

## 2020-09-07 DIAGNOSIS — G4733 Obstructive sleep apnea (adult) (pediatric): Secondary | ICD-10-CM | POA: Diagnosis not present

## 2020-09-07 DIAGNOSIS — R5383 Other fatigue: Secondary | ICD-10-CM | POA: Diagnosis not present

## 2020-09-07 DIAGNOSIS — J984 Other disorders of lung: Secondary | ICD-10-CM | POA: Diagnosis not present

## 2020-09-07 DIAGNOSIS — Z79899 Other long term (current) drug therapy: Secondary | ICD-10-CM | POA: Diagnosis not present

## 2020-09-07 DIAGNOSIS — R5381 Other malaise: Secondary | ICD-10-CM | POA: Diagnosis not present

## 2020-09-07 DIAGNOSIS — I951 Orthostatic hypotension: Secondary | ICD-10-CM | POA: Diagnosis not present

## 2020-09-07 DIAGNOSIS — Z87898 Personal history of other specified conditions: Secondary | ICD-10-CM | POA: Diagnosis not present

## 2020-09-07 DIAGNOSIS — I4892 Unspecified atrial flutter: Secondary | ICD-10-CM | POA: Diagnosis not present

## 2020-09-10 DIAGNOSIS — E876 Hypokalemia: Secondary | ICD-10-CM | POA: Diagnosis not present

## 2020-09-13 DIAGNOSIS — R0982 Postnasal drip: Secondary | ICD-10-CM | POA: Diagnosis not present

## 2020-09-13 DIAGNOSIS — R0600 Dyspnea, unspecified: Secondary | ICD-10-CM | POA: Diagnosis not present

## 2020-09-13 DIAGNOSIS — G4733 Obstructive sleep apnea (adult) (pediatric): Secondary | ICD-10-CM | POA: Diagnosis not present

## 2020-09-13 DIAGNOSIS — R918 Other nonspecific abnormal finding of lung field: Secondary | ICD-10-CM | POA: Diagnosis not present

## 2020-09-15 DIAGNOSIS — U071 COVID-19: Secondary | ICD-10-CM | POA: Diagnosis not present

## 2020-10-03 DIAGNOSIS — J9690 Respiratory failure, unspecified, unspecified whether with hypoxia or hypercapnia: Secondary | ICD-10-CM | POA: Diagnosis not present

## 2020-10-03 DIAGNOSIS — R2681 Unsteadiness on feet: Secondary | ICD-10-CM | POA: Diagnosis not present

## 2020-10-03 DIAGNOSIS — M6281 Muscle weakness (generalized): Secondary | ICD-10-CM | POA: Diagnosis not present

## 2020-10-19 ENCOUNTER — Other Ambulatory Visit: Payer: Self-pay | Admitting: Cardiology

## 2020-10-21 ENCOUNTER — Ambulatory Visit: Payer: PPO | Admitting: Cardiology

## 2020-11-01 DEATH — deceased

## 2020-11-13 ENCOUNTER — Other Ambulatory Visit: Payer: Self-pay | Admitting: Cardiology
